# Patient Record
Sex: Female | Born: 1935 | Race: White | Hispanic: No | Marital: Married | Smoking: Never smoker
Health system: Southern US, Community
[De-identification: ages and names within clinical notes are randomized; demographics above are authoritative.]

## PROBLEM LIST (undated history)

## (undated) DIAGNOSIS — I509 Heart failure, unspecified: Secondary | ICD-10-CM

## (undated) DIAGNOSIS — J8410 Pulmonary fibrosis, unspecified: Secondary | ICD-10-CM

## (undated) DIAGNOSIS — I4891 Unspecified atrial fibrillation: Secondary | ICD-10-CM

## (undated) DIAGNOSIS — E785 Hyperlipidemia, unspecified: Secondary | ICD-10-CM

## (undated) DIAGNOSIS — J449 Chronic obstructive pulmonary disease, unspecified: Secondary | ICD-10-CM

## (undated) DIAGNOSIS — I10 Essential (primary) hypertension: Secondary | ICD-10-CM

## (undated) DIAGNOSIS — I255 Ischemic cardiomyopathy: Secondary | ICD-10-CM

## (undated) HISTORY — DX: Ischemic cardiomyopathy: I25.5

## (undated) HISTORY — DX: Hyperlipidemia, unspecified: E78.5

## (undated) HISTORY — DX: Heart failure, unspecified: I50.9

## (undated) HISTORY — DX: Essential (primary) hypertension: I10

## (undated) HISTORY — DX: Unspecified atrial fibrillation: I48.91

## (undated) HISTORY — DX: Pulmonary fibrosis, unspecified: J84.10

## (undated) HISTORY — DX: Chronic obstructive pulmonary disease, unspecified: J44.9

---

## 1993-08-06 HISTORY — PX: BACK SURGERY: SHX140

## 2007-02-04 ENCOUNTER — Ambulatory Visit: Payer: Self-pay | Admitting: Internal Medicine

## 2007-02-20 ENCOUNTER — Ambulatory Visit: Payer: Self-pay | Admitting: *Deleted

## 2007-02-25 ENCOUNTER — Telehealth: Payer: Self-pay | Admitting: Internal Medicine

## 2007-03-12 ENCOUNTER — Ambulatory Visit: Payer: Self-pay | Admitting: *Deleted

## 2007-03-12 DIAGNOSIS — F32A Depression, unspecified: Secondary | ICD-10-CM | POA: Insufficient documentation

## 2007-03-12 DIAGNOSIS — I10 Essential (primary) hypertension: Secondary | ICD-10-CM | POA: Insufficient documentation

## 2007-03-12 DIAGNOSIS — F329 Major depressive disorder, single episode, unspecified: Secondary | ICD-10-CM

## 2007-03-12 DIAGNOSIS — Z9189 Other specified personal risk factors, not elsewhere classified: Secondary | ICD-10-CM

## 2007-03-12 DIAGNOSIS — I5032 Chronic diastolic (congestive) heart failure: Secondary | ICD-10-CM | POA: Insufficient documentation

## 2007-03-12 DIAGNOSIS — M549 Dorsalgia, unspecified: Secondary | ICD-10-CM | POA: Insufficient documentation

## 2007-03-13 ENCOUNTER — Telehealth (INDEPENDENT_AMBULATORY_CARE_PROVIDER_SITE_OTHER): Payer: Self-pay | Admitting: *Deleted

## 2007-03-17 ENCOUNTER — Encounter (INDEPENDENT_AMBULATORY_CARE_PROVIDER_SITE_OTHER): Payer: Self-pay | Admitting: *Deleted

## 2007-03-31 ENCOUNTER — Encounter (INDEPENDENT_AMBULATORY_CARE_PROVIDER_SITE_OTHER): Payer: Self-pay | Admitting: *Deleted

## 2007-04-15 ENCOUNTER — Encounter (INDEPENDENT_AMBULATORY_CARE_PROVIDER_SITE_OTHER): Payer: Self-pay | Admitting: *Deleted

## 2007-04-17 ENCOUNTER — Encounter: Payer: Self-pay | Admitting: Internal Medicine

## 2008-08-06 HISTORY — PX: INGUINAL HERNIA REPAIR: SUR1180

## 2008-11-08 ENCOUNTER — Telehealth: Payer: Self-pay | Admitting: Internal Medicine

## 2009-11-22 ENCOUNTER — Telehealth: Payer: Self-pay | Admitting: Internal Medicine

## 2009-12-02 ENCOUNTER — Ambulatory Visit: Payer: Self-pay | Admitting: Family Medicine

## 2010-05-24 ENCOUNTER — Telehealth: Payer: Self-pay | Admitting: Internal Medicine

## 2010-09-06 NOTE — Progress Notes (Signed)
Summary: refill request for fibercon  Phone Note Refill Request Message from:  Fax from Pharmacy  Refills Requested: Medication #1:  FIBERCON 625 MG  TABS 2 tabs daily   Last Refilled: 08/29/2009 [facility]axed form from [facility] is on your desk.  Initial call taken by: Lowella Petties CMA,  November 22, 2009 5:06 PM  Follow-up for Phone Call        no longer under my care Follow-up by: Cindee Salt MD,  November 23, 2009 7:59 AM  Additional Follow-up for Phone Call Additional follow up Details #1::        Rx faxed back to pharmacy. Additional Follow-up by: Mervin Hack CMA Duncan Dull),  November 23, 2009 9:14 AM

## 2010-09-06 NOTE — Progress Notes (Signed)
Summary: NO LONGER UNDER DR.LETVAK'S CARE  Phone Note Refill Request Message from:  [facility] pharmacy on May 24, 2010 4:45 PM  Refills Requested: Medication #1:  FIBERCON 625 MG  TABS 2 tabs daily  Method Requested: Electronic Initial call taken by: Mervin Hack CMA Duncan Dull),  May 24, 2010 4:45 PM

## 2011-03-20 ENCOUNTER — Emergency Department: Payer: Self-pay | Admitting: Emergency Medicine

## 2011-03-20 ENCOUNTER — Encounter: Payer: Self-pay | Admitting: Internal Medicine

## 2011-04-07 ENCOUNTER — Encounter: Payer: Self-pay | Admitting: Internal Medicine

## 2011-05-07 ENCOUNTER — Encounter: Payer: Self-pay | Admitting: Internal Medicine

## 2011-08-21 ENCOUNTER — Encounter: Payer: Self-pay | Admitting: Internal Medicine

## 2011-08-21 ENCOUNTER — Ambulatory Visit (INDEPENDENT_AMBULATORY_CARE_PROVIDER_SITE_OTHER): Payer: Medicare Other | Admitting: Internal Medicine

## 2011-08-21 VITALS — BP 190/90 | HR 83 | Temp 97.5°F | Wt 103.0 lb

## 2011-08-21 DIAGNOSIS — I10 Essential (primary) hypertension: Secondary | ICD-10-CM | POA: Insufficient documentation

## 2011-08-21 DIAGNOSIS — J449 Chronic obstructive pulmonary disease, unspecified: Secondary | ICD-10-CM | POA: Insufficient documentation

## 2011-08-21 LAB — COMPREHENSIVE METABOLIC PANEL
AST: 25 U/L (ref 0–37)
BUN: 28 mg/dL — ABNORMAL HIGH (ref 6–23)
CO2: 28 mEq/L (ref 19–32)
Calcium: 9.8 mg/dL (ref 8.4–10.5)
Chloride: 102 mEq/L (ref 96–112)
Creatinine, Ser: 1 mg/dL (ref 0.4–1.2)
GFR: 55.37 mL/min — ABNORMAL LOW (ref >60.00)

## 2011-08-21 LAB — CBC WITH DIFFERENTIAL/PLATELET
Basophils Relative: 0.8 % (ref 0.0–3.0)
Eosinophils Absolute: 0.1 10*3/uL (ref 0.0–0.7)
Hemoglobin: 13.8 g/dL (ref 12.0–15.0)
Lymphocytes Relative: 25.5 % (ref 12.0–46.0)
MCHC: 33.9 g/dL (ref 30.0–36.0)
MCV: 87.9 fl (ref 78.0–100.0)
Neutro Abs: 4.3 10*3/uL (ref 1.4–7.7)
RBC: 4.63 Mil/uL (ref 3.87–5.11)

## 2011-08-21 NOTE — Progress Notes (Signed)
Subjective:    Patient ID: Wendy Sims, female    DOB: Feb 08, 1909, 78 y.o.   MRN: [id number]  HPI 78 year old female with history of hypertension, COPD presents to establish care. She reports that she is generally feeling well. She notes that over the last couple of months she had 2 episodes of COPD exacerbation which required antibiotics. She reports that her breathing has improved. She continues to have some hoarseness in the mornings and occasional cough productive of clear sputum. She denies any shortness of breath. She reports full compliance with her Pulmicort twice daily. She only rarely uses albuterol.  In regards to her hypertension, she reports that her blood pressure is lower generally at home. She notes that her blood pressures typically between 140 and 160/90-100. She is not interested in starting any new medications to help lower her blood pressure. She denies any headache, palpitations, chest pain, or other complaints. She reports full compliance with her carvedilol.  Outpatient Encounter Prescriptions as of 08/21/2011  Medication Sig Dispense Refill  . albuterol (PROVENTIL) (2.5 MG/3ML) 0.083% nebulizer solution Take 2.5 mg by nebulization every 6 (six) hours as needed.      . budesonide (PULMICORT) 0.5 MG/2ML nebulizer solution Take 0.5 mg by nebulization 2 (two) times daily.      . Calcium Polycarbophil (FIBER) 625 MG TABS Take by mouth daily.      . carvedilol (COREG) 6.25 MG tablet Take 6.25 mg by mouth 2 (two) times daily.      . vitamin B-12 (CYANOCOBALAMIN) 500 MCG tablet Take 500 mcg by mouth daily.      Marland Kitchen zolpidem (AMBIEN) 5 MG tablet Take 5 mg by mouth at bedtime as needed.        Review of Systems  Constitutional: Negative for fever, chills, appetite change, fatigue and unexpected weight change.  HENT: Negative for ear pain, congestion, sore throat, trouble swallowing, neck pain, voice change and sinus pressure.   Eyes: Negative for visual disturbance.    Respiratory: Negative for cough, shortness of breath, wheezing and stridor.   Cardiovascular: Negative for chest pain, palpitations and leg swelling.  Gastrointestinal: Negative for nausea, vomiting, abdominal pain, diarrhea, constipation, blood in stool, abdominal distention and anal bleeding.  Genitourinary: Negative for dysuria and flank pain.  Musculoskeletal: Negative for myalgias, arthralgias and gait problem.  Skin: Negative for color change and rash.  Neurological: Negative for dizziness and headaches.  Hematological: Negative for adenopathy. Does not bruise/bleed easily.  Psychiatric/Behavioral: Negative for suicidal ideas, sleep disturbance and dysphoric mood. The patient is not nervous/anxious.    BP 190/90  Pulse 83  Temp(Src) 97.5 F (36.4 C) (Oral)  Wt 103 lb (46.72 kg)  SpO2 98% Repeat BP 160/102    Objective:   Physical Exam  Constitutional: She is oriented to person, place, and time. She appears well-developed and well-nourished. No distress.  HENT:  Head: Normocephalic and atraumatic.  Right Ear: External ear normal.  Left Ear: External ear normal.  Nose: Nose normal.  Mouth/Throat: Oropharynx is clear and moist. No oropharyngeal exudate.  Eyes: Conjunctivae are normal. Pupils are equal, round, and reactive to light. Right eye exhibits no discharge. Left eye exhibits no discharge. No scleral icterus.  Neck: Normal range of motion. Neck supple. No tracheal deviation present. No thyromegaly present.  Cardiovascular: Normal rate, regular rhythm, normal heart sounds and intact distal pulses.  Exam reveals no gallop and no friction rub.   No murmur heard. Pulmonary/Chest: Effort normal and breath sounds normal. No respiratory distress.  She has no wheezes. She has no rales. She exhibits no tenderness.  Abdominal: Soft. Bowel sounds are normal. She exhibits no distension and no mass. There is no tenderness. There is no rebound and no guarding.  Musculoskeletal: Normal  range of motion. She exhibits no edema and no tenderness.  Lymphadenopathy:    She has no cervical adenopathy.  Neurological: She is alert and oriented to person, place, and time. No cranial nerve deficit. She exhibits normal muscle tone. Coordination normal.  Skin: Skin is warm and dry. No rash noted. She is not diaphoretic. No erythema. No pallor.  Psychiatric: She has a normal mood and affect. Her behavior is normal. Judgment and thought content normal.          Assessment & Plan:  1. Hypertension - BP elevated, however pt reports lower BP at home. She is not interested in starting any new medications. Will continue carvedilol and check renal function with labs today. Follow up 3 months.  2. COPD - reports recent exacerbation which has now resolved. Will continue Pulmicort and Albuterol prn. Follow up in 3 months.

## 2011-09-03 ENCOUNTER — Telehealth: Payer: Self-pay | Admitting: Internal Medicine

## 2011-09-03 MED ORDER — ZOLPIDEM TARTRATE 5 MG PO TABS: 5.0000 mg | ORAL_TABLET | Freq: Every evening | ORAL | Status: DC | PRN

## 2011-09-03 NOTE — Telephone Encounter (Signed)
Ambien called to [facility].

## 2011-09-03 NOTE — Telephone Encounter (Signed)
Fine to refill 1 month supply 

## 2011-09-03 NOTE — Telephone Encounter (Signed)
Patient is needing a refill on her Ambien 5 mg.

## 2011-09-03 NOTE — Telephone Encounter (Signed)
Patient is needing something less expensive than Pulmicort 0.5.

## 2011-09-03 NOTE — Telephone Encounter (Signed)
We can try to go thru [employer] to see if she would qualify. OK?

## 2011-09-03 NOTE — Telephone Encounter (Signed)
OKay

## 2011-09-04 ENCOUNTER — Ambulatory Visit: Payer: Self-pay | Admitting: Internal Medicine

## 2011-09-04 NOTE — Telephone Encounter (Signed)
I remember discussing w/dtr at [facility] that inhalers were very difficult for pt to use. I will start process for [employer] referral.

## 2011-09-04 NOTE — Telephone Encounter (Signed)
Pt was started on brovana a few years ago then at the last hospitalization was changed to pulmicort. Daughter wants to know if pt needs to use this med BID or can she stop med and use albuterol PRN? If pt needs to continue pulmicort, will proceed with [employer] forms.

## 2011-09-04 NOTE — Telephone Encounter (Signed)
She needs to continue Inhaled steroid.  Another option would be something like Advair if this is less expensive.

## 2011-09-05 NOTE — Telephone Encounter (Signed)
Left mess to call office back.   

## 2011-09-05 NOTE — Telephone Encounter (Signed)
[employer] referral faxed

## 2011-11-22 ENCOUNTER — Encounter: Payer: Self-pay | Admitting: Internal Medicine

## 2011-11-22 ENCOUNTER — Ambulatory Visit (INDEPENDENT_AMBULATORY_CARE_PROVIDER_SITE_OTHER): Payer: Medicare Other | Admitting: Internal Medicine

## 2011-11-22 VITALS — BP 122/84 | HR 96 | Temp 97.9°F | Wt 107.2 lb

## 2011-11-22 DIAGNOSIS — R5383 Other fatigue: Secondary | ICD-10-CM

## 2011-11-22 DIAGNOSIS — D510 Vitamin B12 deficiency anemia due to intrinsic factor deficiency: Secondary | ICD-10-CM

## 2011-11-22 DIAGNOSIS — K5900 Constipation, unspecified: Secondary | ICD-10-CM | POA: Insufficient documentation

## 2011-11-22 DIAGNOSIS — R109 Unspecified abdominal pain: Secondary | ICD-10-CM | POA: Insufficient documentation

## 2011-11-22 DIAGNOSIS — R5381 Other malaise: Secondary | ICD-10-CM

## 2011-11-22 DIAGNOSIS — I4891 Unspecified atrial fibrillation: Secondary | ICD-10-CM

## 2011-11-22 DIAGNOSIS — E039 Hypothyroidism, unspecified: Secondary | ICD-10-CM

## 2011-11-22 DIAGNOSIS — G4700 Insomnia, unspecified: Secondary | ICD-10-CM

## 2011-11-22 LAB — CBC WITH DIFFERENTIAL/PLATELET
Basophils Relative: 0.5 % (ref 0.0–3.0)
Eosinophils Absolute: 0.5 10*3/uL (ref 0.0–0.7)
Eosinophils Relative: 6 % — ABNORMAL HIGH (ref 0.0–5.0)
Hemoglobin: 12.1 g/dL (ref 12.0–15.0)
Lymphocytes Relative: 13.2 % (ref 12.0–46.0)
MCHC: 33.3 g/dL (ref 30.0–36.0)
Monocytes Relative: 8.8 % (ref 3.0–12.0)
Neutro Abs: 5.6 10*3/uL (ref 1.4–7.7)
Neutrophils Relative %: 71.5 % (ref 43.0–77.0)
RBC: 4.24 Mil/uL (ref 3.87–5.11)
WBC: 7.9 10*3/uL (ref 4.5–10.5)

## 2011-11-22 LAB — POCT URINALYSIS DIPSTICK
Glucose, UA: NEGATIVE
Leukocytes, UA: NEGATIVE
Nitrite, UA: NEGATIVE
Urobilinogen, UA: 0.2

## 2011-11-22 LAB — COMPREHENSIVE METABOLIC PANEL
BUN: 36 mg/dL — ABNORMAL HIGH (ref 6–23)
CO2: 22 mEq/L (ref 19–32)
Calcium: 9.5 mg/dL (ref 8.4–10.5)
Chloride: 97 mEq/L (ref 96–112)
Creatinine, Ser: 1.1 mg/dL (ref 0.4–1.2)
GFR: 51.11 mL/min — ABNORMAL LOW (ref >60.00)
Total Bilirubin: 0.2 mg/dL — ABNORMAL LOW (ref 0.3–1.2)

## 2011-11-22 LAB — TSH: TSH: 1.22 u[IU]/mL (ref 0.35–5.50)

## 2011-11-22 MED ORDER — ZOLPIDEM TARTRATE 5 MG PO TABS: 5.0000 mg | ORAL_TABLET | Freq: Every evening | ORAL | Status: DC | PRN

## 2011-11-22 MED ORDER — DOCUSATE SODIUM 100 MG PO CAPS: 100.0000 mg | ORAL_CAPSULE | Freq: Two times a day (BID) | ORAL | Status: AC

## 2011-11-22 NOTE — Assessment & Plan Note (Signed)
Likely secondary to constipation and possibly urinary tract infection. Will treat constipation with Colace. Will send urine for urinalysis and culture once patient is able to provide sample.

## 2011-11-22 NOTE — Assessment & Plan Note (Signed)
Will add Colace twice daily for the next few days. If no improvement in constipation patient will call the office.

## 2011-11-22 NOTE — Progress Notes (Signed)
Addended by: Jobie Quaker on: 11/22/2011 02:56 PM   Modules accepted: Orders

## 2011-11-22 NOTE — Assessment & Plan Note (Signed)
Likely multifactorial. Patient recently traveled and may have fatigue from this. She also has a history of atrial fibrillation and congestive heart failure, however does not have signs of decompensation or hypervolemia on exam today. Most suspicious of potential urinary tract infection given recent episode of constipation and some right lower quadrant pain. She was unable to give a urine sample today but will bring one by the clinic later today. Will check CBC, CMP, TSH, and B12 with labs today. Followup in one week or sooner if symptoms are worsening.

## 2011-11-22 NOTE — Assessment & Plan Note (Signed)
The patient is documented to have a history of atrial fibrillation and appears to be in atrial fibrillation today. It seems as if she has been rate controlled in the past, however previous records are not available for review. She is not currently anticoagulated. CHADS2 is 3, so relatively high risk of stroke.  Will discuss with family members to see if this has been addressed in the past.  Need to get records from cardiology to see last ECHO and if discussion of anticoagulation was initiated. Follow up 1 week.

## 2011-11-22 NOTE — Progress Notes (Signed)
Subjective:    Patient ID: Wendy Sims, female    DOB: 03-Dec-1908, 78 y.o.   MRN: [id number]  HPI 78 year old female presents for acute visit complaining of general fatigue. She reports that she first developed fatigue after traveling to visit relatives for her birthday. She denies any change in her chronic shortness of breath. She denies any chest pain. She does have some increased constipation which has not been responding to fiber supplements. She notes some mild right lower quadrant and pain. She denies dysuria, fever, chills. She reports a slight reduction in appetite over the last couple of weeks. She denies any nausea, vomiting. She denies any focal weakness.  Outpatient Encounter Prescriptions as of 11/22/2011  Medication Sig Dispense Refill  . albuterol (PROVENTIL) (2.5 MG/3ML) 0.083% nebulizer solution Take 2.5 mg by nebulization every 6 (six) hours as needed.      . budesonide (PULMICORT) 0.5 MG/2ML nebulizer solution Take 0.5 mg by nebulization 2 (two) times daily.      . Calcium Polycarbophil (FIBER) 625 MG TABS Take by mouth daily.      . carvedilol (COREG) 6.25 MG tablet Take 6.25 mg by mouth 2 (two) times daily.      . vitamin B-12 (CYANOCOBALAMIN) 500 MCG tablet Take 500 mcg by mouth daily.      Marland Kitchen zolpidem (AMBIEN) 5 MG tablet Take 1 tablet (5 mg total) by mouth at bedtime as needed.  30 tablet  5  . DISCONTD: zolpidem (AMBIEN) 5 MG tablet Take 1 tablet (5 mg total) by mouth at bedtime as needed.  30 tablet  0  . docusate sodium (COLACE) 100 MG capsule Take 1 capsule (100 mg total) by mouth 2 (two) times daily.  10 capsule  0    Review of Systems  Constitutional: Positive for fatigue. Negative for fever, chills, appetite change and unexpected weight change.  HENT: Negative for ear pain, congestion, sore throat, trouble swallowing, neck pain, voice change and sinus pressure.   Eyes: Negative for visual disturbance.  Respiratory: Positive for shortness of breath (chronic).  Negative for cough, wheezing and stridor.   Cardiovascular: Negative for chest pain, palpitations and leg swelling.  Gastrointestinal: Positive for constipation. Negative for nausea, vomiting, abdominal pain, diarrhea, blood in stool, abdominal distention and anal bleeding.  Genitourinary: Negative for dysuria and flank pain.  Musculoskeletal: Negative for myalgias, arthralgias and gait problem.  Skin: Negative for color change and rash.  Neurological: Negative for dizziness and headaches.  Hematological: Negative for adenopathy. Does not bruise/bleed easily.  Psychiatric/Behavioral: Negative for suicidal ideas, sleep disturbance and dysphoric mood. The patient is not nervous/anxious.    BP 122/84  Pulse 96  Temp(Src) 97.9 F (36.6 C) (Oral)  Wt 107 lb 4 oz (48.648 kg)  SpO2 97%     Objective:   Physical Exam  Constitutional: She is oriented to person, place, and time. She appears well-developed and well-nourished. No distress.  HENT:  Head: Normocephalic and atraumatic.  Right Ear: External ear normal.  Left Ear: External ear normal.  Nose: Nose normal.  Mouth/Throat: Oropharynx is clear and moist. No oropharyngeal exudate.  Eyes: Conjunctivae are normal. Pupils are equal, round, and reactive to light. Right eye exhibits no discharge. Left eye exhibits no discharge. No scleral icterus.  Neck: Normal range of motion. Neck supple. No tracheal deviation present. No thyromegaly present.  Cardiovascular: Normal rate, normal heart sounds and intact distal pulses.  An irregularly irregular rhythm present. Exam reveals no gallop and no friction rub.  No murmur heard. Pulmonary/Chest: Effort normal and breath sounds normal. No respiratory distress. She has no wheezes. She has no rales. She exhibits no tenderness.  Abdominal: Soft. Bowel sounds are normal. She exhibits distension. She exhibits no mass. There is no tenderness. There is no rebound and no guarding.  Musculoskeletal: Normal  range of motion. She exhibits no edema and no tenderness.  Lymphadenopathy:    She has no cervical adenopathy.  Neurological: She is alert and oriented to person, place, and time. No cranial nerve deficit. She exhibits normal muscle tone. Coordination normal.  Skin: Skin is warm and dry. No rash noted. She is not diaphoretic. No erythema. No pallor.  Psychiatric: She has a normal mood and affect. Her behavior is normal. Judgment and thought content normal.          Assessment & Plan:

## 2011-11-23 ENCOUNTER — Other Ambulatory Visit: Payer: Self-pay | Admitting: *Deleted

## 2011-11-23 MED ORDER — SULFAMETHOXAZOLE-TRIMETHOPRIM 400-80 MG PO TABS: 1.0000 | ORAL_TABLET | Freq: Two times a day (BID) | ORAL | Status: AC

## 2011-11-24 LAB — URINE CULTURE: Colony Count: 15000

## 2011-11-26 ENCOUNTER — Other Ambulatory Visit (INDEPENDENT_AMBULATORY_CARE_PROVIDER_SITE_OTHER): Payer: Medicare Other | Admitting: *Deleted

## 2011-11-26 DIAGNOSIS — N390 Urinary tract infection, site not specified: Secondary | ICD-10-CM

## 2011-11-28 ENCOUNTER — Ambulatory Visit: Payer: Medicare Other | Admitting: Internal Medicine

## 2011-11-28 LAB — URINE CULTURE: Colony Count: NO GROWTH

## 2011-11-29 ENCOUNTER — Ambulatory Visit (INDEPENDENT_AMBULATORY_CARE_PROVIDER_SITE_OTHER): Payer: Medicare Other | Admitting: Internal Medicine

## 2011-11-29 ENCOUNTER — Encounter: Payer: Self-pay | Admitting: Internal Medicine

## 2011-11-29 VITALS — BP 187/95 | HR 80 | Wt 107.0 lb

## 2011-11-29 DIAGNOSIS — I10 Essential (primary) hypertension: Secondary | ICD-10-CM

## 2011-11-29 DIAGNOSIS — K5900 Constipation, unspecified: Secondary | ICD-10-CM

## 2011-11-29 DIAGNOSIS — R5381 Other malaise: Secondary | ICD-10-CM

## 2011-11-29 DIAGNOSIS — R5383 Other fatigue: Secondary | ICD-10-CM

## 2011-11-29 NOTE — Assessment & Plan Note (Signed)
Symptoms have improved. Suspect this was related to urinary tract infection. Will continue to monitor.

## 2011-11-29 NOTE — Assessment & Plan Note (Signed)
Blood pressure is elevated today, however patient reports blood pressure has been well controlled at home. She is not interested in starting new medications. Will continue to monitor for now.

## 2011-11-29 NOTE — Assessment & Plan Note (Signed)
Symptoms have improved with resuming fiber regimen. We'll continue to monitor.

## 2011-11-29 NOTE — Progress Notes (Signed)
Subjective:    Patient ID: Wendy Sims, female    DOB: 01-27-1909, 78 y.o.   MRN: [id number]  HPI 78 year old female presents to followup after recent episode of generalized malaise, and constipation. She reports constipation has resolved by getting back on her fiber regimen. In regards to her general malaise, this has also improved. At her last visit, there is question of urinary tract infection. Initial urine culture showed multiple bacteria. She was treated with Bactrim. Repeat urine culture showed no growth. She denies any fever, chills, flank pain. She denies any new concerns today.  She has a history of hypertension, and reports that she checks her blood pressure at home and it is generally well controlled. She denies any chest pain, palpitations, shortness of breath. She reports full compliance with her medications.  Outpatient Encounter Prescriptions as of 11/29/2011  Medication Sig Dispense Refill  . albuterol (PROVENTIL) (2.5 MG/3ML) 0.083% nebulizer solution Take 2.5 mg by nebulization every 6 (six) hours as needed.      . budesonide (PULMICORT) 0.5 MG/2ML nebulizer solution Take 0.5 mg by nebulization 2 (two) times daily.      . Calcium Polycarbophil (FIBER) 625 MG TABS Take by mouth daily.      . carvedilol (COREG) 6.25 MG tablet Take 6.25 mg by mouth 2 (two) times daily.      Marland Kitchen sulfamethoxazole-trimethoprim (SEPTRA) 400-80 MG per tablet Take 1 tablet by mouth 2 (two) times daily.  14 tablet  0  . vitamin B-12 (CYANOCOBALAMIN) 500 MCG tablet Take 500 mcg by mouth daily.      Marland Kitchen zolpidem (AMBIEN) 5 MG tablet Take 1 tablet (5 mg total) by mouth at bedtime as needed.  30 tablet  5  . docusate sodium (COLACE) 100 MG capsule Take 1 capsule (100 mg total) by mouth 2 (two) times daily.  10 capsule  0   BP 187/95  Pulse 80  Wt 107 lb (48.535 kg)  SpO2 97%  Review of Systems  Constitutional: Negative for fever, chills, appetite change, fatigue and unexpected weight change.  HENT:  Negative for ear pain, congestion, sore throat, trouble swallowing, neck pain, voice change and sinus pressure.   Eyes: Negative for visual disturbance.  Respiratory: Negative for cough, shortness of breath, wheezing and stridor.   Cardiovascular: Negative for chest pain, palpitations and leg swelling.  Gastrointestinal: Negative for nausea, vomiting, abdominal pain, diarrhea, constipation, blood in stool, abdominal distention and anal bleeding.  Genitourinary: Negative for dysuria and flank pain.  Musculoskeletal: Negative for myalgias, arthralgias and gait problem.  Skin: Negative for color change and rash.  Neurological: Negative for dizziness and headaches.  Hematological: Negative for adenopathy. Does not bruise/bleed easily.  Psychiatric/Behavioral: Negative for suicidal ideas, sleep disturbance and dysphoric mood. The patient is not nervous/anxious.        Objective:   Physical Exam  Constitutional: She is oriented to person, place, and time. She appears well-developed and well-nourished. No distress.  HENT:  Head: Normocephalic and atraumatic.  Right Ear: External ear normal.  Left Ear: External ear normal.  Nose: Nose normal.  Mouth/Throat: Oropharynx is clear and moist. No oropharyngeal exudate.  Eyes: Conjunctivae are normal. Pupils are equal, round, and reactive to light. Right eye exhibits no discharge. Left eye exhibits no discharge. No scleral icterus.  Neck: Normal range of motion. Neck supple. No tracheal deviation present. No thyromegaly present.  Cardiovascular: Normal rate, regular rhythm and intact distal pulses.  Exam reveals no gallop and no friction rub.   Murmur  heard. Pulmonary/Chest: Effort normal and breath sounds normal. No respiratory distress. She has no wheezes. She has no rales. She exhibits no tenderness.  Musculoskeletal: Normal range of motion. She exhibits no edema and no tenderness.  Lymphadenopathy:    She has no cervical adenopathy.    Neurological: She is alert and oriented to person, place, and time. No cranial nerve deficit. She exhibits normal muscle tone. Coordination normal.  Skin: Skin is warm and dry. No rash noted. She is not diaphoretic. No erythema. No pallor.  Psychiatric: She has a normal mood and affect. Her behavior is normal. Judgment and thought content normal.          Assessment & Plan:

## 2012-01-15 ENCOUNTER — Other Ambulatory Visit: Payer: Self-pay | Admitting: *Deleted

## 2012-01-15 ENCOUNTER — Telehealth: Payer: Self-pay | Admitting: Internal Medicine

## 2012-01-15 MED ORDER — CARVEDILOL 6.25 MG PO TABS: 6.2500 mg | ORAL_TABLET | Freq: Two times a day (BID) | ORAL | Status: DC

## 2012-01-15 NOTE — Telephone Encounter (Signed)
Rx sent in today. 

## 2012-01-15 NOTE — Telephone Encounter (Signed)
Refill on  Coreg 6.25 mg

## 2012-03-04 ENCOUNTER — Telehealth: Payer: Self-pay | Admitting: Internal Medicine

## 2012-03-04 NOTE — Telephone Encounter (Signed)
Should be seen for unilateral leg swelling.

## 2012-03-04 NOTE — Telephone Encounter (Signed)
Left message on cell phone voicemail for Steward Drone to return call.

## 2012-03-04 NOTE — Telephone Encounter (Signed)
Patient coming to see Dr. Dan Humphreys tomorrow at 10:00.

## 2012-03-04 NOTE — Telephone Encounter (Signed)
Pt daughter brenda stop by and stated that ms Sirmons left foot is swelling and wanted to know if you would give her a rx for fluid pill.  Steward Drone stated she gave ms Schomer her lasik  That brenda had for 5 days and this seemed to help medicap  Please advise Pt called yesterday 7/30 and was sent to triage pt refused to leave message

## 2012-03-05 ENCOUNTER — Encounter: Payer: Self-pay | Admitting: Internal Medicine

## 2012-03-05 ENCOUNTER — Ambulatory Visit (INDEPENDENT_AMBULATORY_CARE_PROVIDER_SITE_OTHER): Payer: Medicare Other | Admitting: Internal Medicine

## 2012-03-05 VITALS — BP 118/80 | HR 96 | Temp 97.9°F | Wt 111.5 lb

## 2012-03-05 DIAGNOSIS — I10 Essential (primary) hypertension: Secondary | ICD-10-CM

## 2012-03-05 DIAGNOSIS — R609 Edema, unspecified: Secondary | ICD-10-CM | POA: Insufficient documentation

## 2012-03-05 MED ORDER — FUROSEMIDE 20 MG PO TABS: 20.0000 mg | ORAL_TABLET | Freq: Every day | ORAL | Status: DC

## 2012-03-05 MED ORDER — CARVEDILOL 6.25 MG PO TABS: 6.2500 mg | ORAL_TABLET | Freq: Two times a day (BID) | ORAL | Status: DC

## 2012-03-05 NOTE — Assessment & Plan Note (Signed)
Blood pressure well-controlled on current medications. Will continue. Will repeat renal function in 3 months.

## 2012-03-05 NOTE — Progress Notes (Signed)
Subjective:    Patient ID: Wendy Sims, female    DOB: [birth year], 78 y.o.   MRN: [id number]  HPI 78 year old female presents for acute visit complaining of left greater than right lower extremity edema after taking car trip. Her daughter-in-law noticed the symptoms after her car trip is complete. She gave her some of her own Lasix 20 mg tablets, daily x4 days. She reports that the edema subsequently resolved. She denies any shortness of breath, chest pain, palpitations. She denies any pain in her calves.  Outpatient Encounter Prescriptions as of 03/05/2012  Medication Sig Dispense Refill  . albuterol (PROVENTIL) (2.5 MG/3ML) 0.083% nebulizer solution Take 2.5 mg by nebulization every 6 (six) hours as needed.      . budesonide (PULMICORT) 0.5 MG/2ML nebulizer solution Take 0.5 mg by nebulization 2 (two) times daily.      . Calcium Polycarbophil (FIBER) 625 MG TABS Take by mouth daily.      . carvedilol (COREG) 6.25 MG tablet Take 1 tablet (6.25 mg total) by mouth 2 (two) times daily.  180 tablet  3  . vitamin B-12 (CYANOCOBALAMIN) 500 MCG tablet Take 500 mcg by mouth daily.      Marland Kitchen zolpidem (AMBIEN) 5 MG tablet Take 1 tablet (5 mg total) by mouth at bedtime as needed.  30 tablet  5  . DISCONTD: carvedilol (COREG) 6.25 MG tablet Take 1 tablet (6.25 mg total) by mouth 2 (two) times daily.  60 tablet  3  . furosemide (LASIX) 20 MG tablet Take 1 tablet (20 mg total) by mouth daily.  30 tablet  3   Patient Active Problem List  Diagnosis  . DISORDER, DEPRESSIVE NEC  . HYPERTENSION  . C H F  . BACK PAIN  . INSOMNIA, HX OF  . Hypertension  . COPD (chronic obstructive pulmonary disease)  . Fatigue  . Constipation  . Campath-induced atrial fibrillation     Review of Systems  Constitutional: Negative for fever, chills, appetite change, fatigue and unexpected weight change.  HENT: Negative for ear pain, congestion, sore throat, trouble swallowing, neck pain, voice change and sinus  pressure.   Eyes: Negative for visual disturbance.  Respiratory: Negative for cough, shortness of breath, wheezing and stridor.   Cardiovascular: Positive for leg swelling. Negative for chest pain and palpitations.  Gastrointestinal: Negative for nausea, vomiting, abdominal pain, diarrhea, constipation, blood in stool, abdominal distention and anal bleeding.  Genitourinary: Negative for dysuria and flank pain.  Musculoskeletal: Negative for myalgias, arthralgias and gait problem.  Skin: Negative for color change and rash.  Neurological: Negative for dizziness and headaches.  Hematological: Negative for adenopathy. Does not bruise/bleed easily.  Psychiatric/Behavioral: Negative for suicidal ideas, disturbed wake/sleep cycle and dysphoric mood. The patient is not nervous/anxious.    BP 118/80  Pulse 96  Temp 97.9 F (36.6 C) (Oral)  Wt 111 lb 8 oz (50.576 kg)  SpO2 97%     Objective:   Physical Exam  Constitutional: She is oriented to person, place, and time. She appears well-developed and well-nourished. No distress.  HENT:  Head: Normocephalic and atraumatic.  Right Ear: External ear normal.  Left Ear: External ear normal.  Nose: Nose normal.  Mouth/Throat: Oropharynx is clear and moist. No oropharyngeal exudate.  Eyes: Conjunctivae are normal. Pupils are equal, round, and reactive to light. Right eye exhibits no discharge. Left eye exhibits no discharge. No scleral icterus.  Neck: Normal range of motion. Neck supple. No tracheal deviation present. No thyromegaly present.  Cardiovascular: Normal  rate, normal heart sounds and intact distal pulses.  An irregularly irregular rhythm present. Exam reveals no gallop and no friction rub.   No murmur heard. Pulmonary/Chest: Effort normal and breath sounds normal. No respiratory distress. She has no wheezes. She has no rales. She exhibits no tenderness.  Musculoskeletal: Normal range of motion. She exhibits edema (trace L>R LE). She exhibits  no tenderness.  Lymphadenopathy:    She has no cervical adenopathy.  Neurological: She is alert and oriented to person, place, and time. No cranial nerve deficit. She exhibits normal muscle tone. Coordination normal.  Skin: Skin is warm and dry. No rash noted. She is not diaphoretic. No erythema. No pallor.  Psychiatric: She has a normal mood and affect. Her behavior is normal. Judgment and thought content normal.          Assessment & Plan:

## 2012-03-05 NOTE — Assessment & Plan Note (Signed)
Edema likely secondary to chronic venous insufficiency. Symptoms have resolved after use of Lasix x4 days.  We discussed the potential risk of intravascular volume depletion with overuse of Lasix. She will use conservative measures first including keeping legs elevated and avoiding excessive intake of salt. Will plan to continue to use Lasix as needed for lower extremity edema. Her daughter will monitor her closely.We'll plan to repeat renal function at her next visit in 3 months.

## 2012-03-26 ENCOUNTER — Telehealth: Payer: Self-pay | Admitting: Internal Medicine

## 2012-03-26 NOTE — Telephone Encounter (Signed)
Brenda/Daughter called to report wound left outer leg mom had injured approx 2 weeks ago, she has "picked at it" and now the surface has a dried blood appearance to it, area approx 1" x1/2"  with dry surface, afebrile, denies emergent s/s per Laceration Guideline,See in 24 hr disposition obtained due to sign of infection, she has made an appt for 03/27/12 at 10:15, callback perimeters gone over

## 2012-03-27 ENCOUNTER — Encounter: Payer: Self-pay | Admitting: Nurse Practitioner

## 2012-03-27 ENCOUNTER — Encounter: Payer: Self-pay | Admitting: Cardiothoracic Surgery

## 2012-03-27 ENCOUNTER — Ambulatory Visit (INDEPENDENT_AMBULATORY_CARE_PROVIDER_SITE_OTHER): Payer: Medicare Other | Admitting: Internal Medicine

## 2012-03-27 ENCOUNTER — Encounter: Payer: Self-pay | Admitting: Internal Medicine

## 2012-03-27 VITALS — BP 124/82 | HR 81 | Temp 97.9°F | Wt 110.5 lb

## 2012-03-27 DIAGNOSIS — S81802A Unspecified open wound, left lower leg, initial encounter: Secondary | ICD-10-CM | POA: Insufficient documentation

## 2012-03-27 DIAGNOSIS — S91009A Unspecified open wound, unspecified ankle, initial encounter: Secondary | ICD-10-CM

## 2012-03-27 DIAGNOSIS — R609 Edema, unspecified: Secondary | ICD-10-CM

## 2012-03-27 MED ORDER — SULFAMETHOXAZOLE-TRIMETHOPRIM 400-80 MG PO TABS: 1.0000 | ORAL_TABLET | Freq: Two times a day (BID) | ORAL | Status: AC

## 2012-03-27 NOTE — Progress Notes (Signed)
  Subjective:    Patient ID: Aslin Farinas, female    DOB: 06-28-1909, 78 y.o.   MRN: [id number]  HPI  78 year old female with history of atrial fibrillation and COPD presents for acute visit complaining of a wound on her left lower leg. She is unsure when this developed. He has been present for at least several days. Her daughter-in-law noticed that the wound was becoming darker with more surrounding redness. Patient reports the wound is painful but denies any fever, chills. She has also noted increased swelling in her left lower leg.  Outpatient Encounter Prescriptions as of 03/27/2012  Medication Sig Dispense Refill  . albuterol (PROVENTIL) (2.5 MG/3ML) 0.083% nebulizer solution Take 2.5 mg by nebulization every 6 (six) hours as needed.      . budesonide (PULMICORT) 0.5 MG/2ML nebulizer solution Take 0.5 mg by nebulization 2 (two) times daily.      . Calcium Polycarbophil (FIBER) 625 MG TABS Take by mouth daily.      . carvedilol (COREG) 6.25 MG tablet Take 1 tablet (6.25 mg total) by mouth 2 (two) times daily.  180 tablet  3  . furosemide (LASIX) 20 MG tablet Take 1 tablet (20 mg total) by mouth daily.  30 tablet  3  . vitamin B-12 (CYANOCOBALAMIN) 500 MCG tablet Take 500 mcg by mouth daily.      Marland Kitchen zolpidem (AMBIEN) 5 MG tablet Take 1 tablet (5 mg total) by mouth at bedtime as needed.  30 tablet  5  . sulfamethoxazole-trimethoprim (SEPTRA) 400-80 MG per tablet Take 1 tablet by mouth 2 (two) times daily.  14 tablet  0    BP 124/82  Pulse 81  Temp 97.9 F (36.6 C) (Oral)  Wt 110 lb 8 oz (50.122 kg)  SpO2 94%  Review of Systems  Constitutional: Negative for fever, chills and fatigue.  Respiratory: Negative for shortness of breath.   Cardiovascular: Positive for leg swelling. Negative for chest pain.  Skin: Positive for color change and wound.       Objective:   Physical Exam  Constitutional: She is oriented to person, place, and time. She appears well-developed and  well-nourished. No distress.  HENT:  Head: Normocephalic and atraumatic.  Eyes: Pupils are equal, round, and reactive to light.  Neck: Normal range of motion.  Pulmonary/Chest: Effort normal.  Musculoskeletal: She exhibits edema and tenderness.  Neurological: She is alert and oriented to person, place, and time.  Skin: Skin is warm. She is not diaphoretic. There is erythema.     Psychiatric: She has a normal mood and affect. Her behavior is normal. Judgment and thought content normal.          Assessment & Plan:

## 2012-03-27 NOTE — Assessment & Plan Note (Signed)
Left lower extremity wounds likely secondary to skin tear which is now also rated. We discussed the risk of poor healing given ongoing issues with edema and location of wound. I think she would benefit from evaluation at the wound healing Center with possible debridement and application of dressing such as a honey-based dressing. Appointment scheduled for today.

## 2012-03-27 NOTE — Assessment & Plan Note (Signed)
Patient with chronic lower extremity edema likely secondary to chronic venous insufficiency. Uses Lasix intermittently. Encouraged her to keep legs elevated as much as possible and limit sodium intake. Followup 2 weeks.

## 2012-03-31 ENCOUNTER — Ambulatory Visit: Payer: Medicare Other | Admitting: Internal Medicine

## 2012-05-19 ENCOUNTER — Other Ambulatory Visit: Payer: Self-pay | Admitting: Internal Medicine

## 2012-05-19 DIAGNOSIS — I10 Essential (primary) hypertension: Secondary | ICD-10-CM

## 2012-05-19 DIAGNOSIS — R609 Edema, unspecified: Secondary | ICD-10-CM

## 2012-05-19 MED ORDER — CARVEDILOL 6.25 MG PO TABS: 6.2500 mg | ORAL_TABLET | Freq: Two times a day (BID) | ORAL | Status: DC

## 2012-05-19 MED ORDER — FUROSEMIDE 20 MG PO TABS: 20.0000 mg | ORAL_TABLET | Freq: Every day | ORAL | Status: DC

## 2012-05-19 NOTE — Telephone Encounter (Signed)
Spoke with patient's daughter and patient needs Coreg Rx refilled, Rx for Lasix was refilled by I called the pharmacy to cancel the Lasix Rx.  Rx for Coreg sent electronically.

## 2012-05-19 NOTE — Telephone Encounter (Signed)
Pt's daughter is calling concerning pt's meds. She is completely out of her b/p meds and didn't have one to take today. She uses [facility] and is needing a refill as soon as possible.

## 2012-05-30 ENCOUNTER — Telehealth: Payer: Self-pay | Admitting: Internal Medicine

## 2012-05-30 DIAGNOSIS — G4700 Insomnia, unspecified: Secondary | ICD-10-CM

## 2012-05-30 NOTE — Telephone Encounter (Signed)
Refill request for controlled substance  zolpidem tartrate 5 mg tab Sig: take one tablet at bedtime as needed.

## 2012-05-30 NOTE — Telephone Encounter (Signed)
Pt is needing refill on sleeping meds and would like 90 day supply. She uses [facility].

## 2012-05-31 NOTE — Telephone Encounter (Signed)
Fine to fill. #90 refill 1

## 2012-06-02 MED ORDER — ZOLPIDEM TARTRATE 5 MG PO TABS: 5.0000 mg | ORAL_TABLET | Freq: Every evening | ORAL | Status: DC | PRN

## 2012-06-02 NOTE — Telephone Encounter (Signed)
Rx called to [facility] pharmacy. 

## 2012-06-11 ENCOUNTER — Telehealth: Payer: Self-pay | Admitting: Internal Medicine

## 2012-06-11 ENCOUNTER — Ambulatory Visit (INDEPENDENT_AMBULATORY_CARE_PROVIDER_SITE_OTHER): Payer: Medicare Other | Admitting: Internal Medicine

## 2012-06-11 ENCOUNTER — Encounter: Payer: Self-pay | Admitting: Internal Medicine

## 2012-06-11 VITALS — BP 122/80 | HR 89 | Temp 98.7°F | Wt 114.5 lb

## 2012-06-11 DIAGNOSIS — F3289 Other specified depressive episodes: Secondary | ICD-10-CM

## 2012-06-11 DIAGNOSIS — I10 Essential (primary) hypertension: Secondary | ICD-10-CM

## 2012-06-11 DIAGNOSIS — Z23 Encounter for immunization: Secondary | ICD-10-CM

## 2012-06-11 DIAGNOSIS — R0609 Other forms of dyspnea: Secondary | ICD-10-CM

## 2012-06-11 DIAGNOSIS — R609 Edema, unspecified: Secondary | ICD-10-CM

## 2012-06-11 DIAGNOSIS — F329 Major depressive disorder, single episode, unspecified: Secondary | ICD-10-CM

## 2012-06-11 DIAGNOSIS — R0600 Dyspnea, unspecified: Secondary | ICD-10-CM

## 2012-06-11 LAB — COMPREHENSIVE METABOLIC PANEL
ALT: 10 U/L (ref 0–35)
Albumin: 3.5 g/dL (ref 3.5–5.2)
CO2: 28 mEq/L (ref 19–32)
Calcium: 9.3 mg/dL (ref 8.4–10.5)
Chloride: 100 mEq/L (ref 96–112)
GFR: 54.64 mL/min — ABNORMAL LOW (ref >60.00)
Potassium: 4.7 mEq/L (ref 3.5–5.1)
Sodium: 134 mEq/L — ABNORMAL LOW (ref 135–145)
Total Bilirubin: 0.5 mg/dL (ref 0.3–1.2)
Total Protein: 7.1 g/dL (ref 6.0–8.3)

## 2012-06-11 NOTE — Progress Notes (Signed)
Subjective:    Patient ID: Wendy Sims, female    DOB: 1909-02-03, 78 y.o.   MRN: [id number]  HPI 78 year old female with history of atrial fibrillation, congestive heart failure, depression, insomnia presents for followup. She presents with her daughter who reports that patient has been more anxious and tearful recently. They're currently going through issue in her family with illness of one of the grandchildren. Patient reports that she has difficulty dealing with this situation and finds herself frequently worrying. She denies difficulty sleeping and has been using her Ambien to help with sleep. She does not want to start any medication to help with depression or anxiety.  Her daughter has also noted that she appears to be more short of breath recently. Patient notes that she has not been taking her Lasix on a regular basis. She has noted increased shortness of breath compared to baseline and increased swelling in her lower extremities. She has been trying to limit sodium in her diet and stay physically active. She denies chest pain, fever, chills, palpitations.  Outpatient Encounter Prescriptions as of 06/11/2012  Medication Sig Dispense Refill  . albuterol (PROVENTIL) (2.5 MG/3ML) 0.083% nebulizer solution Take 2.5 mg by nebulization every 6 (six) hours as needed.      . budesonide (PULMICORT) 0.5 MG/2ML nebulizer solution Take 0.5 mg by nebulization 2 (two) times daily.      . Calcium Polycarbophil (FIBER) 625 MG TABS Take by mouth daily.      . carvedilol (COREG) 6.25 MG tablet Take 1 tablet (6.25 mg total) by mouth 2 (two) times daily.  180 tablet  3  . furosemide (LASIX) 20 MG tablet Take 1 tablet (20 mg total) by mouth daily.  30 tablet  6  . vitamin B-12 (CYANOCOBALAMIN) 500 MCG tablet Take 500 mcg by mouth daily.      Marland Kitchen zolpidem (AMBIEN) 5 MG tablet Take 1 tablet (5 mg total) by mouth at bedtime as needed.  90 tablet  1   BP 122/80  Pulse 89  Temp 98.7 F (37.1 C) (Oral)  Wt  114 lb 8 oz (51.937 kg)  SpO2 99%   Review of Systems  Constitutional: Negative for fever, chills, appetite change, fatigue and unexpected weight change.  HENT: Negative for ear pain, congestion, sore throat, trouble swallowing, neck pain, voice change and sinus pressure.   Eyes: Negative for visual disturbance.  Respiratory: Positive for shortness of breath. Negative for cough, wheezing and stridor.   Cardiovascular: Positive for leg swelling. Negative for chest pain and palpitations.  Gastrointestinal: Negative for nausea, vomiting, abdominal pain, diarrhea, constipation, blood in stool, abdominal distention and anal bleeding.  Genitourinary: Negative for dysuria and flank pain.  Musculoskeletal: Negative for myalgias, arthralgias and gait problem.  Skin: Negative for color change and rash.  Neurological: Negative for dizziness and headaches.  Hematological: Negative for adenopathy. Does not bruise/bleed easily.  Psychiatric/Behavioral: Positive for sleep disturbance and dysphoric mood. Negative for suicidal ideas. The patient is nervous/anxious.        Objective:   Physical Exam  Constitutional: She is oriented to person, place, and time. She appears well-developed and well-nourished. No distress.  HENT:  Head: Normocephalic and atraumatic.  Right Ear: External ear normal.  Left Ear: External ear normal.  Nose: Nose normal.  Mouth/Throat: Oropharynx is clear and moist. No oropharyngeal exudate.  Eyes: Conjunctivae normal are normal. Pupils are equal, round, and reactive to light. Right eye exhibits no discharge. Left eye exhibits no discharge. No scleral icterus.  Neck: Normal range of motion. Neck supple. No tracheal deviation present. No thyromegaly present.  Cardiovascular: Normal rate, regular rhythm, normal heart sounds and intact distal pulses.  Exam reveals no gallop and no friction rub.   No murmur heard. Pulmonary/Chest: Effort normal. No accessory muscle usage. Not  tachypneic. No respiratory distress. She has no decreased breath sounds. She has no wheezes. She has no rhonchi. She has rales in the right lower field and the left lower field. She exhibits no tenderness.  Musculoskeletal: Normal range of motion. She exhibits no edema and no tenderness.  Lymphadenopathy:    She has no cervical adenopathy.  Neurological: She is alert and oriented to person, place, and time. No cranial nerve deficit. She exhibits normal muscle tone. Coordination normal.  Skin: Skin is warm and dry. No rash noted. She is not diaphoretic. No erythema. No pallor.  Psychiatric: Her behavior is normal. Judgment and thought content normal. Her mood appears anxious. She exhibits a depressed mood.          Assessment & Plan:

## 2012-06-11 NOTE — Telephone Encounter (Signed)
Labs today were normal.

## 2012-06-11 NOTE — Assessment & Plan Note (Signed)
Blood pressure well-controlled on current medications. Will continue. Will check renal function with labs today. Followup in 6 months or sooner as needed.

## 2012-06-11 NOTE — Assessment & Plan Note (Signed)
Edema slightly increased today in lower extremities and crackles noted on pulmonary exam consistent with hypervolemia. Patient has forgotten to take her furosemide for the last several days. Encouraged her to take this medication daily. Will continue to monitor.

## 2012-06-11 NOTE — Assessment & Plan Note (Signed)
Patient has some chronic shortness of breath. Appears to be hypervolemic on exam today, likely leading to worsening dyspnea. Encouraged her to take furosemide daily. She will continue to monitor symptoms if symptoms of dyspnea and lower extremity edema are not improving with use of furosemide she will call or return to clinic.

## 2012-06-11 NOTE — Assessment & Plan Note (Signed)
Patient tearful today describing ongoing medical issues in her family. Offered support today. Patient does not wish to start any additional medications to help with symptoms of depression at this time. We'll continue to monitor.

## 2012-06-13 NOTE — Telephone Encounter (Signed)
Message left on machine at home advising patient as instructed. 

## 2012-06-28 ENCOUNTER — Emergency Department: Payer: Self-pay | Admitting: Emergency Medicine

## 2012-06-28 LAB — CBC
HCT: 34.4 % — ABNORMAL LOW (ref 40.0–52.0)
HGB: 11.4 g/dL — ABNORMAL LOW (ref 12.0–16.0)
MCH: 28.2 pg (ref 26.0–34.0)
MCV: 85 fL (ref 80–100)
Platelet: 214 10*3/uL (ref 150–440)
WBC: 8.6 10*3/uL (ref 3.8–10.6)

## 2012-06-28 LAB — BASIC METABOLIC PANEL
Anion Gap: 6 — ABNORMAL LOW (ref 7–16)
BUN: 25 mg/dL — ABNORMAL HIGH (ref 7–18)
Calcium, Total: 9.4 mg/dL (ref 8.5–10.1)
Chloride: 101 mmol/L (ref 98–107)
Co2: 25 mmol/L (ref 21–32)
Creatinine: 1 mg/dL (ref 0.60–1.30)
EGFR (African American): 53 — ABNORMAL LOW
EGFR (Non-African Amer.): 46 — ABNORMAL LOW
Glucose: 96 mg/dL (ref 65–99)
Osmolality: 269 (ref 275–301)
Potassium: 4.5 mmol/L (ref 3.5–5.1)
Sodium: 132 mmol/L — ABNORMAL LOW (ref 136–145)

## 2012-06-28 LAB — PRO B NATRIURETIC PEPTIDE: B-Type Natriuretic Peptide: 3684 pg/mL — ABNORMAL HIGH (ref 0–125)

## 2012-06-28 LAB — TROPONIN I: Troponin-I: <0.02 ng/mL

## 2012-07-09 ENCOUNTER — Ambulatory Visit (INDEPENDENT_AMBULATORY_CARE_PROVIDER_SITE_OTHER)
Admission: RE | Admit: 2012-07-09 | Discharge: 2012-07-09 | Disposition: A | Discharge status: Home / Self Care | Payer: Medicare Other | Source: Ambulatory Visit | Attending: Internal Medicine | Admitting: Internal Medicine

## 2012-07-09 ENCOUNTER — Ambulatory Visit (INDEPENDENT_AMBULATORY_CARE_PROVIDER_SITE_OTHER): Payer: Medicare Other | Admitting: Internal Medicine

## 2012-07-09 ENCOUNTER — Encounter: Payer: Self-pay | Admitting: Internal Medicine

## 2012-07-09 VITALS — BP 128/80 | HR 77 | Temp 98.4°F | Resp 16 | Wt 107.2 lb

## 2012-07-09 DIAGNOSIS — R0600 Dyspnea, unspecified: Secondary | ICD-10-CM

## 2012-07-09 DIAGNOSIS — I509 Heart failure, unspecified: Secondary | ICD-10-CM

## 2012-07-09 DIAGNOSIS — I4891 Unspecified atrial fibrillation: Secondary | ICD-10-CM

## 2012-07-09 DIAGNOSIS — R0609 Other forms of dyspnea: Secondary | ICD-10-CM

## 2012-07-09 LAB — COMPREHENSIVE METABOLIC PANEL
ALT: 9 U/L (ref 0–35)
Alkaline Phosphatase: 70 U/L (ref 39–117)
CO2: 27 mEq/L (ref 19–32)
Creatinine, Ser: 1 mg/dL (ref 0.4–1.2)
GFR: 53.39 mL/min — ABNORMAL LOW (ref >60.00)
Total Bilirubin: 0.4 mg/dL (ref 0.3–1.2)

## 2012-07-09 NOTE — Progress Notes (Signed)
Subjective:    Patient ID: Wendy Sims, female    DOB: [birth year], 78 y.o.   MRN: [id number]  HPI 78 year old female with history of atrial fibrillation and congestive heart failure presents for followup after recent ER evaluation for dyspnea. Her daughter reports that she was taken to the hospital complaining of shortness of breath. She was given injection of Lasix with improvement in her symptoms. Chest x-ray performed during that evaluation showed edema. She was told to take her Lasix on a daily basis and her dose of carvedilol was increased. During the ER evaluation she was noted to have elevated blood pressure, 200/140. Her daughter reports that this has resolved at home. The patient has resumed her previous dose of carvedilol 6.25 mg twice daily. She has been taking Lasix approximately twice per week. She reports some persistent shortness of breath but denies chest pain, fever, cough, chest pain.  Outpatient Encounter Prescriptions as of 07/09/2012  Medication Sig Dispense Refill  . albuterol (PROVENTIL) (2.5 MG/3ML) 0.083% nebulizer solution Take 2.5 mg by nebulization every 6 (six) hours as needed.      . Calcium Polycarbophil (FIBER) 625 MG TABS Take by mouth daily.      . carvedilol (COREG) 6.25 MG tablet Take 1 tablet (6.25 mg total) by mouth 2 (two) times daily.  180 tablet  3  . furosemide (LASIX) 20 MG tablet Take 1 tablet (20 mg total) by mouth daily.  30 tablet  6  . vitamin B-12 (CYANOCOBALAMIN) 500 MCG tablet Take 500 mcg by mouth daily.      Marland Kitchen zolpidem (AMBIEN) 5 MG tablet Take 1 tablet (5 mg total) by mouth at bedtime as needed.  90 tablet  1  . budesonide (PULMICORT) 0.5 MG/2ML nebulizer solution Take 0.5 mg by nebulization 2 (two) times daily.        Review of Systems  Constitutional: Negative for fever, chills, appetite change, fatigue and unexpected weight change.  HENT: Negative for ear pain, congestion, sore throat, trouble swallowing, neck pain, voice change and  sinus pressure.   Eyes: Negative for visual disturbance.  Respiratory: Positive for shortness of breath. Negative for cough, wheezing and stridor.   Cardiovascular: Negative for chest pain, palpitations and leg swelling.  Gastrointestinal: Negative for nausea, vomiting, abdominal pain, diarrhea, constipation, blood in stool, abdominal distention and anal bleeding.  Genitourinary: Negative for dysuria and flank pain.  Musculoskeletal: Negative for myalgias, arthralgias and gait problem.  Skin: Negative for color change and rash.  Neurological: Negative for dizziness and headaches.  Hematological: Negative for adenopathy. Does not bruise/bleed easily.  Psychiatric/Behavioral: Negative for suicidal ideas, sleep disturbance and dysphoric mood. The patient is not nervous/anxious.        Objective:   Physical Exam  Constitutional: She is oriented to person, place, and time. She appears well-developed and well-nourished. No distress.  HENT:  Head: Normocephalic and atraumatic.  Right Ear: External ear normal.  Left Ear: External ear normal.  Nose: Nose normal.  Mouth/Throat: Oropharynx is clear and moist. No oropharyngeal exudate.  Eyes: Conjunctivae normal are normal. Pupils are equal, round, and reactive to light. Right eye exhibits no discharge. Left eye exhibits no discharge. No scleral icterus.  Neck: Normal range of motion. Neck supple. No tracheal deviation present. No thyromegaly present.  Cardiovascular: Normal rate, normal heart sounds and intact distal pulses.  An irregularly irregular rhythm present. Exam reveals no gallop and no friction rub.   No murmur heard. Pulmonary/Chest: Effort normal. No accessory muscle usage. Not tachypneic.  No respiratory distress. She has no decreased breath sounds. She has no wheezes. She has no rhonchi. She has rales in the left lower field. She exhibits no tenderness.  Musculoskeletal: Normal range of motion. She exhibits no edema and no tenderness.   Lymphadenopathy:    She has no cervical adenopathy.  Neurological: She is alert and oriented to person, place, and time. No cranial nerve deficit. She exhibits normal muscle tone. Coordination normal.  Skin: Skin is warm and dry. No rash noted. She is not diaphoretic. No erythema. No pallor.  Psychiatric: She has a normal mood and affect. Her behavior is normal. Judgment and thought content normal.          Assessment & Plan:

## 2012-07-09 NOTE — Assessment & Plan Note (Signed)
Secondary to acute exacerbation of congestive heart failure. Given that symptoms are persistent and she has some focal crackles in the left lower lobe on exam, will get a chest x-ray. Followup 2 weeks.

## 2012-07-09 NOTE — Patient Instructions (Signed)
Please increase Lasix (Furosemide) to 20mg  daily.  We will check labs today and get a chest xray.  Follow up 2 weeks.

## 2012-07-09 NOTE — Assessment & Plan Note (Signed)
Rate has been well controlled with carvedilol. Will continue. As previous notes, unclear why patient is not anticoagulated. CHADS2 score is 3. Will again request records from her cardiologist.

## 2012-07-09 NOTE — Assessment & Plan Note (Signed)
Patient with recent exacerbation of congestive heart failure. Encouraged her to take her Lasix 20 mg on a daily basis. Encouraged her to avoid intake of excessive salt. She will continue carvedilol 6.25 mg twice daily. We'll recheck a BNP today. Will also get a chest x-ray. If symptoms are persisting, would favor increasing Lasix to twice daily and repeating ECHO.

## 2012-07-15 ENCOUNTER — Inpatient Hospital Stay: Payer: Self-pay | Admitting: Family Medicine

## 2012-07-15 ENCOUNTER — Telehealth: Payer: Self-pay | Admitting: Internal Medicine

## 2012-07-15 LAB — BASIC METABOLIC PANEL
Anion Gap: 6 — ABNORMAL LOW (ref 7–16)
Calcium, Total: 9.2 mg/dL (ref 8.5–10.1)
Chloride: 93 mmol/L — ABNORMAL LOW (ref 98–107)
Co2: 28 mmol/L (ref 21–32)
EGFR (Non-African Amer.): 42 — ABNORMAL LOW
Glucose: 92 mg/dL (ref 65–99)
Osmolality: 259 (ref 275–301)
Potassium: 4.5 mmol/L (ref 3.5–5.1)
Sodium: 127 mmol/L — ABNORMAL LOW (ref 136–145)

## 2012-07-15 LAB — CBC
HCT: 31.7 % — ABNORMAL LOW (ref 40.0–52.0)
MCHC: 33.9 g/dL (ref 32.0–36.0)
MCV: 84 fL (ref 80–100)
Platelet: 252 10*3/uL (ref 150–440)
RBC: 3.78 10*6/uL — ABNORMAL LOW (ref 4.40–5.90)
RDW: 13.6 % (ref 11.5–14.5)
WBC: 11.5 10*3/uL — ABNORMAL HIGH (ref 3.8–10.6)

## 2012-07-15 LAB — PRO B NATRIURETIC PEPTIDE: B-Type Natriuretic Peptide: 7291 pg/mL — ABNORMAL HIGH (ref 0–125)

## 2012-07-15 NOTE — Telephone Encounter (Signed)
Patient is needing a mobility chair . The daughter is wanting someone to call her in reference to mobility testing.

## 2012-07-15 NOTE — Telephone Encounter (Signed)
In the past, a representative from the company has come with the pt to their visit, as specific things need to be addressed. We can set up a visit for this.

## 2012-07-16 LAB — BASIC METABOLIC PANEL
Calcium, Total: 8.7 mg/dL (ref 8.5–10.1)
EGFR (African American): 48 — ABNORMAL LOW
EGFR (Non-African Amer.): 41 — ABNORMAL LOW
Glucose: 147 mg/dL — ABNORMAL HIGH (ref 65–99)
Osmolality: 263 (ref 275–301)
Sodium: 127 mmol/L — ABNORMAL LOW (ref 136–145)

## 2012-07-16 LAB — CBC WITH DIFFERENTIAL/PLATELET
Basophil #: 0 10*3/uL (ref 0.0–0.1)
Basophil %: 0.2 %
Eosinophil #: 0 10*3/uL (ref 0.0–0.7)
Lymphocyte %: 15.1 %
MCH: 29.1 pg (ref 26.0–34.0)
MCHC: 34.9 g/dL (ref 32.0–36.0)
MCV: 84 fL (ref 80–100)
Monocyte #: 0.1 10*3/uL — ABNORMAL LOW (ref 0.2–1.0)
Monocyte %: 1.7 %
Neutrophil %: 82.7 %
Platelet: 235 10*3/uL (ref 150–440)
RBC: 3.7 10*6/uL — ABNORMAL LOW (ref 4.40–5.90)

## 2012-07-16 LAB — SODIUM, URINE, RANDOM: Sodium, Urine Random: 9 mmol/L (ref 20–110)

## 2012-07-16 LAB — OSMOLALITY, URINE: Osmolality: 407 mOsm/kg

## 2012-07-16 NOTE — Telephone Encounter (Signed)
Pt has been hospitalized. Visit will wait until discharge.

## 2012-07-17 ENCOUNTER — Telehealth: Payer: Self-pay | Admitting: Internal Medicine

## 2012-07-17 LAB — CBC WITH DIFFERENTIAL/PLATELET
Basophil #: 0 10*3/uL (ref 0.0–0.1)
Basophil %: 0.2 %
Eosinophil #: 0 10*3/uL (ref 0.0–0.7)
HCT: 30 % — ABNORMAL LOW (ref 40.0–52.0)
HGB: 10.2 g/dL — ABNORMAL LOW (ref 12.0–16.0)
Lymphocyte #: 1.8 10*3/uL (ref 1.0–3.6)
MCHC: 33.9 g/dL (ref 32.0–36.0)
MCV: 83 fL (ref 80–100)
Monocyte #: 0.8 10*3/uL (ref 0.2–1.0)
Monocyte %: 6.6 %
Neutrophil #: 9.6 10*3/uL — ABNORMAL HIGH (ref 1.4–6.5)
Platelet: 273 10*3/uL (ref 150–440)
RDW: 13.7 % (ref 11.5–14.5)
WBC: 12.3 10*3/uL — ABNORMAL HIGH (ref 3.8–10.6)

## 2012-07-17 LAB — BASIC METABOLIC PANEL
Anion Gap: 9 (ref 7–16)
BUN: 44 mg/dL — ABNORMAL HIGH (ref 7–18)
Co2: 26 mmol/L (ref 21–32)
Creatinine: 1.16 mg/dL (ref 0.60–1.30)
EGFR (African American): 44 — ABNORMAL LOW
EGFR (Non-African Amer.): 38 — ABNORMAL LOW
Glucose: 109 mg/dL — ABNORMAL HIGH (ref 65–99)
Sodium: 126 mmol/L — ABNORMAL LOW (ref 136–145)

## 2012-07-17 LAB — PRO B NATRIURETIC PEPTIDE: B-Type Natriuretic Peptide: 4706 pg/mL — ABNORMAL HIGH (ref 0–125)

## 2012-07-17 NOTE — Telephone Encounter (Signed)
Scheduled Pt for 07/31/12 at 2:15 pm. I called Hospital and let Velna Hatchet know.

## 2012-07-17 NOTE — Telephone Encounter (Signed)
Waiting on discharge paperwork from hospital.

## 2012-07-17 NOTE — Telephone Encounter (Signed)
[facility] is calling and wanting to schedule a 30 min hospital f/u for COPD and Dr. Tilman Neat schedule is so packed I wanted to ask before putting her in some where. Where should I put this pt ????  Best number Velna Hatchet [phone] They are trying to Discharge pt now.

## 2012-07-17 NOTE — Telephone Encounter (Signed)
Can we put her in the day after Christmas?  Shanda Bumps needs to call this pt today or tomorrow, with a copy of the DC summary in hand, to review her hospital course and plan of care, with documentation in a telephone note.

## 2012-07-18 NOTE — Telephone Encounter (Signed)
HH nurse notified

## 2012-07-18 NOTE — Telephone Encounter (Signed)
Denise from [facility] called wanting a verbal order from Dr. Dan Humphreys for Home Health Orders. Please call back at [phone].

## 2012-07-18 NOTE — Telephone Encounter (Signed)
Fine for verbal order for home health.

## 2012-07-22 ENCOUNTER — Telehealth: Payer: Self-pay | Admitting: Internal Medicine

## 2012-07-22 NOTE — Telephone Encounter (Signed)
[facility] is calling and wanting to know if they could add Assistance with dressing and grooming to his physical therapy. Best call back number [phone].

## 2012-07-22 NOTE — Telephone Encounter (Signed)
That is fine 

## 2012-07-22 NOTE — Telephone Encounter (Signed)
Home Health notified.

## 2012-07-23 ENCOUNTER — Encounter: Payer: Self-pay | Admitting: Internal Medicine

## 2012-07-23 ENCOUNTER — Telehealth: Payer: Self-pay | Admitting: Internal Medicine

## 2012-07-23 ENCOUNTER — Ambulatory Visit (INDEPENDENT_AMBULATORY_CARE_PROVIDER_SITE_OTHER): Payer: Medicare Other | Admitting: Internal Medicine

## 2012-07-23 VITALS — BP 122/62 | HR 82 | Temp 97.7°F | Resp 17 | Ht 60.0 in | Wt 114.5 lb

## 2012-07-23 DIAGNOSIS — R0609 Other forms of dyspnea: Secondary | ICD-10-CM

## 2012-07-23 DIAGNOSIS — R2681 Unsteadiness on feet: Secondary | ICD-10-CM | POA: Insufficient documentation

## 2012-07-23 DIAGNOSIS — J449 Chronic obstructive pulmonary disease, unspecified: Secondary | ICD-10-CM

## 2012-07-23 DIAGNOSIS — I509 Heart failure, unspecified: Secondary | ICD-10-CM

## 2012-07-23 DIAGNOSIS — R531 Weakness: Secondary | ICD-10-CM | POA: Insufficient documentation

## 2012-07-23 DIAGNOSIS — R5383 Other fatigue: Secondary | ICD-10-CM

## 2012-07-23 DIAGNOSIS — R0600 Dyspnea, unspecified: Secondary | ICD-10-CM

## 2012-07-23 DIAGNOSIS — R269 Unspecified abnormalities of gait and mobility: Secondary | ICD-10-CM

## 2012-07-23 LAB — COMPREHENSIVE METABOLIC PANEL
ALT: 11 U/L (ref 0–35)
AST: 17 U/L (ref 0–37)
Albumin: 3.6 g/dL (ref 3.5–5.2)
BUN: 40 mg/dL — ABNORMAL HIGH (ref 6–23)
CO2: 30 mEq/L (ref 19–32)
Calcium: 9.6 mg/dL (ref 8.4–10.5)
Chloride: 94 mEq/L — ABNORMAL LOW (ref 96–112)
Creatinine, Ser: 1.3 mg/dL — ABNORMAL HIGH (ref 0.4–1.2)
GFR: 40.25 mL/min — ABNORMAL LOW (ref >60.00)
Potassium: 4.7 mEq/L (ref 3.5–5.1)

## 2012-07-23 LAB — CBC WITH DIFFERENTIAL/PLATELET
Basophils Absolute: 0 10*3/uL (ref 0.0–0.1)
Eosinophils Absolute: 0.4 10*3/uL (ref 0.0–0.7)
Hemoglobin: 12 g/dL (ref 12.0–15.0)
Lymphocytes Relative: 15.9 % (ref 12.0–46.0)
MCHC: 33.1 g/dL (ref 30.0–36.0)
Monocytes Relative: 0.9 % — ABNORMAL LOW (ref 3.0–12.0)
Neutrophils Relative %: 79.5 % — ABNORMAL HIGH (ref 43.0–77.0)
RDW: 14.3 % (ref 11.5–14.6)

## 2012-07-23 LAB — BRAIN NATRIURETIC PEPTIDE: Pro B Natriuretic peptide (BNP): 286 pg/mL — ABNORMAL HIGH (ref 0.0–100.0)

## 2012-07-23 MED ORDER — LISINOPRIL 10 MG PO TABS: 10.0000 mg | ORAL_TABLET | Freq: Every day | ORAL | Status: DC

## 2012-07-23 NOTE — Assessment & Plan Note (Signed)
Dyspnea is improved after diuresis and treatment with steroids. Exam is normal today. We'll continue to monitor. Followup one week.

## 2012-07-23 NOTE — Assessment & Plan Note (Addendum)
Status post recent hospitalization for CHF exacerbation and COPD exacerbation. Appears euvolemic on exam today. Labs suggest that she is slightly dehydrated. BNP is improved compared to hospitalization. Will continue Lasix daily. We'll continue lisinopril. Plan to repeat renal function next week. Followup one week.  Over spent face to face with pt and her daughter discussing recent hospitalization, plan of care for CHF and COPD and mobility evaluation.

## 2012-07-23 NOTE — Assessment & Plan Note (Signed)
Elderly female with gait instability and and weakness. She also has functional limitation secondary to degenerative arthritis in her spine, arms, hands, and legs. She requires assistance for dressing, grooming, and bathing. A cane or Wendy Sims is inadequate because of her unsteady gait and recurrent falls as well as poor balance. She is unable to operate a manual wheelchair because of grip strength less than 2/5 in her hands. She also has decreased range of motion in her shoulders and joints. She is unable to operate a scooter because she lacks postural stability in her home environment is not provide adequate access for maneuvering a scooter. I feel that a power wheelchair would improve her ability to complete activities of daily living such as getting from the bed to the restroom. He would also enable her to have more quality interaction with her family and participate in activities outside the home. She has physical and mental ability to operate a wheelchair safely in her home and outside the home. She is willing and motivated to use a power wheelchair.

## 2012-07-23 NOTE — Assessment & Plan Note (Signed)
Ongoing fatigue after recent hospitalization for CHF exacerbation and COPD exacerbation. Labs reflect mild dehydration. We'll reduce Lasix to once daily. CBC was normal except for slight elevation of WBC likely secondary to prednisone use. Will continue to monitor. Follow up 1 week.

## 2012-07-23 NOTE — Telephone Encounter (Signed)
Ptis needing to know how many times a day she needs to do Albuteral inhaler.

## 2012-07-23 NOTE — Progress Notes (Signed)
Subjective:    Patient ID: Wendy Sims, female    DOB: 10-Oct-1908, 78 y.o.   MRN: [id number]  HPI 78 year old female with history of congestive heart failure, COPD, osteoarthritis presents for followup after recent hospitalization for CHF exacerbation and COPD exacerbation. She reports symptoms of shortness of breath have improved after treatment with prednisone and diuretics. She reports some ongoing fatigue after her hospitalization. She denies chest pain, palpitations. She does have some shortness of breath with minimal exertion, such as walking less than 10 feet. She does report that her appetite is good. She has been compliant with her medications. She continues to live at an assisted living facility. She also spends time with her daughter and son-in-law.  She also presents for a mobility examination. She is interested in obtaining a motorized wheelchair to better allow her to perform activities of daily living. She feels that she would be able to operate powered wheelchair without difficulty. She is not able to operate a standard wheelchair because of severe osteoarthritis in her hands which limit her grip strength. She also has generalized weakness in her shoulders and arms. She has attempted to propel a wheelchair in the past and been unsuccessful. She is not able to use a scooter because of instability in her trunk. She has had several falls at home. Over the last few months, her congestive heart failure and COPD have limited her ability to ambulate more than 10 feet without shortness of breath. She feels that a motorized scooter would allow her to perform activities such as moving from her bedroom to her bathroom.    Outpatient Encounter Prescriptions as of 07/23/2012  Medication Sig Dispense Refill  . albuterol (PROVENTIL) (2.5 MG/3ML) 0.083% nebulizer solution Take 2.5 mg by nebulization 4 (four) times daily.       . Calcium Polycarbophil (FIBER) 625 MG TABS Take by mouth daily.       . carvedilol (COREG) 6.25 MG tablet Take 1 tablet (6.25 mg total) by mouth 2 (two) times daily.  180 tablet  3  . furosemide (LASIX) 20 MG tablet Take 1 tablet (20 mg total) by mouth daily.  30 tablet  6  . lisinopril (PRINIVIL,ZESTRIL) 10 MG tablet Take 1 tablet (10 mg total) by mouth daily.  30 tablet  6  . vitamin B-12 (CYANOCOBALAMIN) 500 MCG tablet Take 500 mcg by mouth daily.      Marland Kitchen zolpidem (AMBIEN) 5 MG tablet Take 2.5 mg by mouth at bedtime as needed.       BP 122/62  Pulse 82  Temp 97.7 F (36.5 C) (Oral)  Resp 17  Wt 114 lb 8 oz (51.937 kg)  SpO2 95%  Review of Systems  Constitutional: Positive for fatigue. Negative for fever, chills, appetite change and unexpected weight change.  HENT: Negative for ear pain, congestion, sore throat, trouble swallowing, neck pain, voice change and sinus pressure.   Eyes: Negative for visual disturbance.  Respiratory: Positive for shortness of breath. Negative for cough, wheezing and stridor.   Cardiovascular: Negative for chest pain, palpitations and leg swelling.  Gastrointestinal: Negative for nausea, vomiting, abdominal pain, diarrhea, constipation, blood in stool, abdominal distention and anal bleeding.  Genitourinary: Negative for dysuria and flank pain.  Musculoskeletal: Positive for myalgias, back pain and arthralgias. Negative for gait problem.  Skin: Negative for color change and rash.  Neurological: Positive for weakness. Negative for dizziness and headaches.  Hematological: Negative for adenopathy. Does not bruise/bleed easily.  Psychiatric/Behavioral: Negative for suicidal ideas, sleep disturbance  and dysphoric mood. The patient is not nervous/anxious.        Objective:   Physical Exam  Constitutional: She is oriented to person, place, and time. She appears well-developed and well-nourished. No distress.  HENT:  Head: Normocephalic and atraumatic.  Right Ear: External ear normal.  Left Ear: External ear normal.  Nose:  Nose normal.  Mouth/Throat: Oropharynx is clear and moist. No oropharyngeal exudate.  Eyes: Conjunctivae normal are normal. Pupils are equal, round, and reactive to light. Right eye exhibits no discharge. Left eye exhibits no discharge. No scleral icterus.  Neck: Normal range of motion. Neck supple. No tracheal deviation present. No thyromegaly present.  Cardiovascular: Normal rate, regular rhythm and intact distal pulses.  Exam reveals no gallop and no friction rub.   Murmur heard. Pulmonary/Chest: Effort normal and breath sounds normal. No accessory muscle usage. Not tachypneic. No respiratory distress. She has no decreased breath sounds. She has no wheezes. She has no rhonchi. She has no rales. She exhibits no tenderness.  Musculoskeletal: She exhibits no edema and no tenderness.       Right elbow: She exhibits decreased range of motion.       Left elbow: She exhibits decreased range of motion.       Right wrist: She exhibits decreased range of motion.       Left wrist: She exhibits decreased range of motion.       Cervical back: She exhibits decreased range of motion, bony tenderness, deformity (kyphosis) and pain.       Thoracic back: She exhibits decreased range of motion, tenderness, bony tenderness and pain.       Lumbar back: She exhibits decreased range of motion, bony tenderness and pain. Deformity: kyphosis.       Right hand: She exhibits decreased range of motion, tenderness and bony tenderness. Decreased strength (3/5) noted. She exhibits finger abduction, thumb/finger opposition and wrist extension trouble.       Left hand: She exhibits decreased range of motion and tenderness. Decreased strength (3/5) noted. She exhibits finger abduction and thumb/finger opposition.       Strength bilateral LE 4/5 lower leg flexor/ext, 4/5 hip abd.  Gait is unstable with occasional shuffling of feet.  Pt requires assistance to stand from seated position.   Pt can walk approximately 25yards with  assitance of walker, however this is significantly limited by shortness of breath, gait instability.  Lymphadenopathy:    She has no cervical adenopathy.  Neurological: She is alert and oriented to person, place, and time. No cranial nerve deficit. She exhibits normal muscle tone. Coordination normal.  Skin: Skin is warm and dry. No rash noted. She is not diaphoretic. No erythema. No pallor.  Psychiatric: She has a normal mood and affect. Her behavior is normal. Judgment and thought content normal.          Assessment & Plan:

## 2012-07-23 NOTE — Assessment & Plan Note (Signed)
Symptomatically improved after treatment with steroid taper. We'll continue to monitor. Continue albuterol to 4 times daily as needed. Followup in one week for recheck.

## 2012-07-23 NOTE — Telephone Encounter (Signed)
Called left message to advise max of 4 times daily 2 puffs each time.

## 2012-07-31 ENCOUNTER — Ambulatory Visit (INDEPENDENT_AMBULATORY_CARE_PROVIDER_SITE_OTHER): Payer: Medicare Other | Admitting: Internal Medicine

## 2012-07-31 ENCOUNTER — Encounter: Payer: Self-pay | Admitting: Internal Medicine

## 2012-07-31 VITALS — BP 130/82 | HR 79 | Temp 97.9°F | Resp 16 | Wt 114.0 lb

## 2012-07-31 DIAGNOSIS — I509 Heart failure, unspecified: Secondary | ICD-10-CM

## 2012-07-31 DIAGNOSIS — B029 Zoster without complications: Secondary | ICD-10-CM | POA: Insufficient documentation

## 2012-07-31 MED ORDER — GABAPENTIN 100 MG PO CAPS: 100.0000 mg | ORAL_CAPSULE | Freq: Every day | ORAL | Status: DC

## 2012-07-31 MED ORDER — VALACYCLOVIR HCL 1 G PO TABS: 1000.0000 mg | ORAL_TABLET | Freq: Two times a day (BID) | ORAL | Status: DC

## 2012-07-31 NOTE — Assessment & Plan Note (Signed)
Symptomatically doing well. Has some lower extremity edema today which has increased from previous. Will increase dose of Lasix to twice daily for the next 3 days. Continue lisinopril and carvedilol. Continue daily weights. Followup in 2 weeks.

## 2012-07-31 NOTE — Progress Notes (Signed)
Subjective:    Patient ID: Wendy Sims, female    DOB: 1908/09/08, 78 y.o.   MRN: [id number]  HPI 78 year old female with history of congestive heart failure status post recent admission for CHF exacerbation presents for followup. In regards to heart failure, her daughter reports she is generally been doing well. She seems less short of breath. Over the last couple of days, she has had some increased swelling in her ankles. Her daughter increased her dose of Lasix to twice daily for one day. Her weight is up slightly from baseline of 110-114.  Patient is concerned today about a rash over her right upper back. Rash started approximately 2 days ago. It is described as extremely itchy. She has been applying warm compresses to the area with some improvement in symptoms. The itching has kept her awake at night. It is slightly painful. She denies any fever or chills. Notably, she previously had shingles at this site several years ago.  Outpatient Encounter Prescriptions as of 07/31/2012  Medication Sig Dispense Refill  . acetaminophen (TYLENOL) 650 MG CR tablet Take 650 mg by mouth 2 (two) times daily.      Marland Kitchen albuterol (PROVENTIL) (2.5 MG/3ML) 0.083% nebulizer solution Take 2.5 mg by nebulization 4 (four) times daily.       . Calcium Polycarbophil (FIBER) 625 MG TABS Take by mouth daily.      . carvedilol (COREG) 6.25 MG tablet Take 1 tablet (6.25 mg total) by mouth 2 (two) times daily.  180 tablet  3  . furosemide (LASIX) 20 MG tablet Take 1 tablet (20 mg total) by mouth daily.  30 tablet  6  . gabapentin (NEURONTIN) 100 MG capsule Take 1 capsule (100 mg total) by mouth at bedtime.  30 capsule  3  . lisinopril (PRINIVIL,ZESTRIL) 10 MG tablet Take 1 tablet (10 mg total) by mouth daily.  30 tablet  6  . valACYclovir (VALTREX) 1000 MG tablet Take 1 tablet (1,000 mg total) by mouth 2 (two) times daily.  20 tablet  0  . vitamin B-12 (CYANOCOBALAMIN) 500 MCG tablet Take 500 mcg by mouth daily.      Marland Kitchen  zolpidem (AMBIEN) 5 MG tablet Take 2.5 mg by mouth at bedtime as needed.       BP 130/82  Pulse 79  Temp 97.9 F (36.6 C) (Oral)  Resp 16  Wt 114 lb (51.71 kg)  SpO2 98%  Review of Systems  Constitutional: Negative for fever, chills, appetite change, fatigue and unexpected weight change.  HENT: Negative for ear pain, congestion, sore throat, trouble swallowing, neck pain, voice change and sinus pressure.   Eyes: Negative for visual disturbance.  Respiratory: Negative for cough, shortness of breath, wheezing and stridor.   Cardiovascular: Positive for leg swelling. Negative for chest pain and palpitations.  Gastrointestinal: Negative for nausea, vomiting, abdominal pain, diarrhea, constipation, blood in stool, abdominal distention and anal bleeding.  Genitourinary: Negative for dysuria and flank pain.  Musculoskeletal: Negative for myalgias, arthralgias and gait problem.  Skin: Positive for color change and rash.  Neurological: Negative for dizziness and headaches.  Hematological: Negative for adenopathy. Does not bruise/bleed easily.  Psychiatric/Behavioral: Negative for suicidal ideas, sleep disturbance and dysphoric mood. The patient is not nervous/anxious.        Objective:   Physical Exam  Constitutional: She is oriented to person, place, and time. She appears well-developed and well-nourished. No distress.  HENT:  Head: Normocephalic and atraumatic.  Right Ear: External ear normal.  Left Ear:  External ear normal.  Nose: Nose normal.  Mouth/Throat: Oropharynx is clear and moist. No oropharyngeal exudate.  Eyes: Conjunctivae normal are normal. Pupils are equal, round, and reactive to light. Right eye exhibits no discharge. Left eye exhibits no discharge. No scleral icterus.  Neck: Normal range of motion. Neck supple. No tracheal deviation present. No thyromegaly present.  Cardiovascular: Normal rate, regular rhythm, normal heart sounds and intact distal pulses.  Exam reveals  no gallop and no friction rub.   No murmur heard. Pulmonary/Chest: Effort normal and breath sounds normal. No respiratory distress. She has no wheezes. She has no rales. She exhibits no tenderness.  Musculoskeletal: Normal range of motion. She exhibits edema (pitting bilateral ankles). She exhibits no tenderness.  Lymphadenopathy:    She has no cervical adenopathy.  Neurological: She is alert and oriented to person, place, and time. No cranial nerve deficit. She exhibits normal muscle tone. Coordination normal.  Skin: Skin is warm and dry. Rash noted. Rash is vesicular. She is not diaphoretic. There is erythema. No pallor.     Psychiatric: She has a normal mood and affect. Her behavior is normal. Judgment and thought content normal.          Assessment & Plan:

## 2012-07-31 NOTE — Assessment & Plan Note (Signed)
Rash over her right posterior back is most consistent with shingles. Will treat with Valtrex twice daily x10 days and Neurontin 100 mg at bedtime for itching. Will titrate Neurontin dose as tolerated. Patient's daughter will call with an update on Friday.

## 2012-08-04 ENCOUNTER — Encounter: Payer: Self-pay | Admitting: Internal Medicine

## 2012-08-07 ENCOUNTER — Telehealth: Payer: Self-pay | Admitting: Internal Medicine

## 2012-08-07 NOTE — Telephone Encounter (Signed)
Wendy Sims left voicemail on 08/04/12  checking the status of paperwork.  If you call her back please use ref # [id number]

## 2012-08-11 DIAGNOSIS — I4891 Unspecified atrial fibrillation: Secondary | ICD-10-CM

## 2012-08-11 DIAGNOSIS — I509 Heart failure, unspecified: Secondary | ICD-10-CM

## 2012-08-11 DIAGNOSIS — J441 Chronic obstructive pulmonary disease with (acute) exacerbation: Secondary | ICD-10-CM

## 2012-08-12 NOTE — Telephone Encounter (Signed)
Paperwork has been submitted three times. Will try again this afternoon.

## 2012-08-18 ENCOUNTER — Encounter: Payer: Self-pay | Admitting: Internal Medicine

## 2012-08-18 ENCOUNTER — Ambulatory Visit (INDEPENDENT_AMBULATORY_CARE_PROVIDER_SITE_OTHER): Payer: Medicare Other | Admitting: Internal Medicine

## 2012-08-18 VITALS — BP 148/108 | HR 94 | Wt 108.0 lb

## 2012-08-18 DIAGNOSIS — I4891 Unspecified atrial fibrillation: Secondary | ICD-10-CM

## 2012-08-18 DIAGNOSIS — R0600 Dyspnea, unspecified: Secondary | ICD-10-CM

## 2012-08-18 DIAGNOSIS — N182 Chronic kidney disease, stage 2 (mild): Secondary | ICD-10-CM

## 2012-08-18 DIAGNOSIS — I10 Essential (primary) hypertension: Secondary | ICD-10-CM

## 2012-08-18 DIAGNOSIS — R0989 Other specified symptoms and signs involving the circulatory and respiratory systems: Secondary | ICD-10-CM

## 2012-08-18 NOTE — Assessment & Plan Note (Signed)
Rate well controlled. Not on anticoagulation, CHADS2 score is 3. High risk of falls with gait instability, so will continue off anticoagulation.

## 2012-08-18 NOTE — Assessment & Plan Note (Signed)
BP slightly elevated today, but pt reports has been well controlled at home. Will continue current medications. Follow up 3 months and prn.

## 2012-08-18 NOTE — Assessment & Plan Note (Addendum)
Will recheck renal function with labs today. 

## 2012-08-18 NOTE — Progress Notes (Signed)
Subjective:    Patient ID: Wendy Sims, female    DOB: 06-15-1909, 79 y.o.   MRN: [id number]  HPI 79YO female with AFIB, CHF, CKD3 presents for follow up. Doing well. No recurrent episodes of dyspnea. Continues to have some mild fatigue, and poor appetite. No focal symptoms such as chest pain or palpitations. No new concerns today.  Outpatient Encounter Prescriptions as of 08/18/2012  Medication Sig Dispense Refill  . acetaminophen (TYLENOL) 650 MG CR tablet Take 650 mg by mouth 2 (two) times daily.      Marland Kitchen albuterol (PROVENTIL) (2.5 MG/3ML) 0.083% nebulizer solution Take 2.5 mg by nebulization 4 (four) times daily.       . Calcium Polycarbophil (FIBER) 625 MG TABS Take by mouth daily.      . carvedilol (COREG) 6.25 MG tablet Take 1 tablet (6.25 mg total) by mouth 2 (two) times daily.  180 tablet  3  . furosemide (LASIX) 20 MG tablet Take 1 tablet (20 mg total) by mouth daily.  30 tablet  6  . lisinopril (PRINIVIL,ZESTRIL) 10 MG tablet Take 1 tablet (10 mg total) by mouth daily.  30 tablet  6  . vitamin B-12 (CYANOCOBALAMIN) 500 MCG tablet Take 500 mcg by mouth daily.      Marland Kitchen zolpidem (AMBIEN) 5 MG tablet Take 2.5 mg by mouth at bedtime as needed.       BP 148/108  Pulse 94  Wt 108 lb (48.988 kg)  SpO2 97%  Review of Systems  Constitutional: Positive for fatigue. Negative for fever, chills, appetite change and unexpected weight change.  HENT: Negative for ear pain, congestion, sore throat, trouble swallowing, neck pain, voice change and sinus pressure.   Eyes: Negative for visual disturbance.  Respiratory: Negative for cough, shortness of breath, wheezing and stridor.   Cardiovascular: Negative for chest pain, palpitations and leg swelling.  Gastrointestinal: Negative for nausea, vomiting, abdominal pain, diarrhea, constipation, blood in stool, abdominal distention and anal bleeding.  Genitourinary: Negative for dysuria and flank pain.  Musculoskeletal: Positive for gait problem.  Negative for myalgias and arthralgias.  Skin: Negative for color change and rash.  Neurological: Negative for dizziness and headaches.  Hematological: Negative for adenopathy. Does not bruise/bleed easily.  Psychiatric/Behavioral: Negative for suicidal ideas, sleep disturbance and dysphoric mood. The patient is not nervous/anxious.        Objective:   Physical Exam  Constitutional: She is oriented to person, place, and time. She appears well-developed and well-nourished. No distress.  HENT:  Head: Normocephalic and atraumatic.  Right Ear: External ear normal.  Left Ear: External ear normal.  Nose: Nose normal.  Mouth/Throat: Oropharynx is clear and moist. No oropharyngeal exudate.  Eyes: Conjunctivae normal are normal. Pupils are equal, round, and reactive to light. Right eye exhibits no discharge. Left eye exhibits no discharge. No scleral icterus.  Neck: Normal range of motion. Neck supple. No tracheal deviation present. No thyromegaly present.  Cardiovascular: Normal rate and intact distal pulses.  An irregularly irregular rhythm present. Exam reveals no gallop and no friction rub.   Murmur heard. Pulmonary/Chest: Effort normal and breath sounds normal. No respiratory distress. She has no wheezes. She has no rales. She exhibits no tenderness.  Musculoskeletal: Normal range of motion. She exhibits no edema and no tenderness.  Lymphadenopathy:    She has no cervical adenopathy.  Neurological: She is alert and oriented to person, place, and time. No cranial nerve deficit. She exhibits normal muscle tone. Coordination normal.  Skin: Skin is warm  and dry. No rash noted. She is not diaphoretic. No erythema. No pallor.  Psychiatric: She has a normal mood and affect. Her behavior is normal. Judgment and thought content normal.          Assessment & Plan:

## 2012-08-19 LAB — COMPREHENSIVE METABOLIC PANEL
ALT: 12 U/L (ref 0–35)
AST: 22 U/L (ref 0–37)
Albumin: 4.5 g/dL (ref 3.5–5.2)
Alkaline Phosphatase: 69 U/L (ref 39–117)
BUN: 41 mg/dL — ABNORMAL HIGH (ref 6–23)
Calcium: 10.3 mg/dL (ref 8.4–10.5)
Chloride: 99 mEq/L (ref 96–112)
Potassium: 5.5 mEq/L — ABNORMAL HIGH (ref 3.5–5.1)
Sodium: 134 mEq/L — ABNORMAL LOW (ref 135–145)
Total Protein: 8.1 g/dL (ref 6.0–8.3)

## 2012-08-25 NOTE — Telephone Encounter (Signed)
Paperwork re-faxed today by Dayna Barker.

## 2012-09-04 ENCOUNTER — Ambulatory Visit (INDEPENDENT_AMBULATORY_CARE_PROVIDER_SITE_OTHER): Payer: Medicare Other | Admitting: Internal Medicine

## 2012-09-04 ENCOUNTER — Encounter: Payer: Self-pay | Admitting: Internal Medicine

## 2012-09-04 VITALS — BP 160/80 | HR 86 | Temp 97.7°F | Wt 111.0 lb

## 2012-09-04 DIAGNOSIS — R269 Unspecified abnormalities of gait and mobility: Secondary | ICD-10-CM

## 2012-09-04 DIAGNOSIS — R2681 Unsteadiness on feet: Secondary | ICD-10-CM

## 2012-09-04 NOTE — Telephone Encounter (Signed)
Patient in today for visit to fill out corrections on paperwork.

## 2012-09-04 NOTE — Progress Notes (Signed)
Subjective:    Patient ID: Wendy Sims, female    DOB: 1909-05-08, 79 y.o.   MRN: [id number]  HPI  79 year old female with history of congestive heart failure, COPD, osteoarthritis presents for mobility examination.She is interested in obtaining a motorized wheelchair to better allow her to perform activities of daily living. She feels that she would be able to operate powered wheelchair without difficulty. She is not able to operate a standard wheelchair because of severe osteoarthritis in her hands which limit her grip strength and range of motion. She also has generalized weakness and limited range of motion in her shoulders and arms. She has attempted to propel a wheelchair in the past and been unsuccessful. She is not able to use a scooter because of instability in her trunk and limited range of motion. She has had several falls at home and has poor balance. Over the last few months, her congestive heart failure and COPD have limited her ability to ambulate more than 10 feet without shortness of breath. She feels that a motorized power wheelchair would allow her to perform activities such as moving from her bedroom to her bathroom. A scooter would not suffice because of limited space in her home environment.   Outpatient Encounter Prescriptions as of 07/23/2012  Medication Sig Dispense Refill  . albuterol (PROVENTIL) (2.5 MG/3ML) 0.083% nebulizer solution Take 2.5 mg by nebulization 4 (four) times daily.       . Calcium Polycarbophil (FIBER) 625 MG TABS Take by mouth daily.      . carvedilol (COREG) 6.25 MG tablet Take 1 tablet (6.25 mg total) by mouth 2 (two) times daily.  180 tablet  3  . furosemide (LASIX) 20 MG tablet Take 1 tablet (20 mg total) by mouth daily.  30 tablet  6  . lisinopril (PRINIVIL,ZESTRIL) 10 MG tablet Take 1 tablet (10 mg total) by mouth daily.  30 tablet  6  . vitamin B-12 (CYANOCOBALAMIN) 500 MCG tablet Take 500 mcg by mouth daily.      Marland Kitchen zolpidem (AMBIEN) 5 MG  tablet Take 2.5 mg by mouth at bedtime as needed.       BP 160/80  Pulse 86  Temp 97.7 F (36.5 C) (Oral)  Wt 111 lb (50.349 kg)  SpO2 96%  Review of Systems  Constitutional: Negative for fever, chills, appetite change, fatigue and unexpected weight change.  HENT: Negative for ear pain, congestion, sore throat, trouble swallowing, neck pain, voice change and sinus pressure.   Eyes: Negative for visual disturbance.  Respiratory: Negative for cough, shortness of breath, wheezing and stridor.   Cardiovascular: Negative for chest pain, palpitations and leg swelling.  Gastrointestinal: Negative for nausea, vomiting, abdominal pain, diarrhea, constipation, blood in stool, abdominal distention and anal bleeding.  Genitourinary: Negative for dysuria and flank pain.  Musculoskeletal: Positive for myalgias, back pain, arthralgias and gait problem.  Skin: Negative for color change and rash.  Neurological: Positive for weakness. Negative for dizziness and headaches.  Hematological: Negative for adenopathy. Does not bruise/bleed easily.  Psychiatric/Behavioral: Negative for suicidal ideas, sleep disturbance and dysphoric mood. The patient is not nervous/anxious.        Objective:   Physical Exam  Constitutional: She is oriented to person, place, and time. She appears well-developed and well-nourished. No distress.  HENT:  Head: Normocephalic and atraumatic.  Right Ear: External ear normal.  Left Ear: External ear normal.  Nose: Nose normal.  Mouth/Throat: Oropharynx is clear and moist. No oropharyngeal exudate.  Eyes: Conjunctivae normal  are normal. Pupils are equal, round, and reactive to light. Right eye exhibits no discharge. Left eye exhibits no discharge. No scleral icterus.  Neck: Normal range of motion. Neck supple. No tracheal deviation present. No thyromegaly present.  Cardiovascular: Normal rate, regular rhythm and intact distal pulses.  Exam reveals no gallop and no friction rub.    Murmur heard. Pulmonary/Chest: Effort normal and breath sounds normal. No accessory muscle usage. Not tachypneic. No respiratory distress. She has no decreased breath sounds. She has no wheezes. She has no rhonchi. She has no rales. She exhibits no tenderness.  Musculoskeletal: She exhibits no edema and no tenderness.       Right elbow: She exhibits decreased range of motion.       Left elbow: She exhibits decreased range of motion.       Right wrist: She exhibits decreased range of motion.       Left wrist: She exhibits decreased range of motion.       Cervical back: She exhibits decreased range of motion, bony tenderness, deformity (kyphosis) and pain.       Thoracic back: She exhibits decreased range of motion, tenderness, bony tenderness and pain.       Lumbar back: She exhibits decreased range of motion, bony tenderness and pain. Deformity: kyphosis.       Right hand: She exhibits decreased range of motion, tenderness, bony tenderness and deformity (contractures from arthritis). Decreased strength (3/5) noted. She exhibits finger abduction, thumb/finger opposition and wrist extension trouble.       Left hand: She exhibits decreased range of motion, tenderness and deformity (contractures from arthritis). Decreased strength (3/5) noted. She exhibits finger abduction and thumb/finger opposition.       Strength bilateral LE 4/5 lower leg flexor/ext, 4/5 hip abd.  Gait is unstable with occasional shuffling of feet.  Pt requires assistance to stand from seated position.   Pt can walk approximately 25yards with assitance of walker, however this is significantly limited by shortness of breath, gait instability. Shortness of breath occurs at 10 feet.  Lymphadenopathy:    She has no cervical adenopathy.  Neurological: She is alert and oriented to person, place, and time. No cranial nerve deficit. She exhibits normal muscle tone. Coordination normal.  Skin: Skin is warm and dry. No rash noted. She  is not diaphoretic. No erythema. No pallor.  Psychiatric: She has a normal mood and affect. Her behavior is normal. Judgment and thought content normal.          Assessment & Plan:

## 2012-09-04 NOTE — Assessment & Plan Note (Signed)
Elderly female with gait instability and and weakness. She also has functional limitation secondary to degenerative arthritis in her spine, arms, hands, and legs. She requires assistance for dressing, grooming, and bathing. A cane or Zackrey Dyar is inadequate because of her unsteady gait and recurrent falls as well as poor balance. She is unable to operate a manual wheelchair because of grip strength less than 3/5 in her handsj and very poor range of motion with contractures. She is unable to operate a scooter because she lacks postural stability and her home environment is not provide adequate access for maneuvering a scooter. I feel that a power wheelchair would improve her ability to complete activities of daily living such as getting from the bed to the restroom. He would also enable her to have more quality interaction with her family and participate in activities outside the home. She has physical and mental ability to operate a wheelchair safely in her home and outside the home. She is willing and motivated to use a power wheelchair.

## 2012-09-09 ENCOUNTER — Other Ambulatory Visit: Payer: Self-pay | Admitting: *Deleted

## 2012-09-09 DIAGNOSIS — R609 Edema, unspecified: Secondary | ICD-10-CM

## 2012-09-09 MED ORDER — FUROSEMIDE 20 MG PO TABS: 20.0000 mg | ORAL_TABLET | Freq: Every day | ORAL | Status: DC

## 2012-09-09 NOTE — Telephone Encounter (Signed)
Med filled.  

## 2012-09-24 ENCOUNTER — Ambulatory Visit: Payer: Medicare Other | Admitting: Internal Medicine

## 2012-11-17 ENCOUNTER — Encounter: Payer: Self-pay | Admitting: Internal Medicine

## 2012-11-17 ENCOUNTER — Ambulatory Visit (INDEPENDENT_AMBULATORY_CARE_PROVIDER_SITE_OTHER): Payer: Medicare Other | Admitting: Internal Medicine

## 2012-11-17 VITALS — BP 130/74 | HR 74 | Temp 97.7°F | Wt 110.0 lb

## 2012-11-17 DIAGNOSIS — I10 Essential (primary) hypertension: Secondary | ICD-10-CM

## 2012-11-17 DIAGNOSIS — R609 Edema, unspecified: Secondary | ICD-10-CM

## 2012-11-17 DIAGNOSIS — I509 Heart failure, unspecified: Secondary | ICD-10-CM

## 2012-11-17 LAB — COMPREHENSIVE METABOLIC PANEL
AST: 16 U/L (ref 0–37)
Albumin: 4.1 g/dL (ref 3.5–5.2)
Alkaline Phosphatase: 64 U/L (ref 39–117)
BUN: 35 mg/dL — ABNORMAL HIGH (ref 6–23)
Creatinine, Ser: 1.3 mg/dL — ABNORMAL HIGH (ref 0.4–1.2)
Glucose, Bld: 86 mg/dL (ref 70–99)
Potassium: 5.2 mEq/L — ABNORMAL HIGH (ref 3.5–5.1)

## 2012-11-17 MED ORDER — LISINOPRIL 10 MG PO TABS: 10.0000 mg | ORAL_TABLET | Freq: Every day | ORAL | Status: DC

## 2012-11-17 MED ORDER — FUROSEMIDE 20 MG PO TABS: 20.0000 mg | ORAL_TABLET | Freq: Every day | ORAL | Status: DC

## 2012-11-17 MED ORDER — CARVEDILOL 6.25 MG PO TABS: 6.2500 mg | ORAL_TABLET | Freq: Two times a day (BID) | ORAL | Status: DC

## 2012-11-17 NOTE — Assessment & Plan Note (Signed)
Repeat renal function with labs today. 

## 2012-11-17 NOTE — Assessment & Plan Note (Signed)
Symptomatically doing well. Appears euvolemic on exam. Continue current medications.

## 2012-11-17 NOTE — Assessment & Plan Note (Signed)
BP Readings from Last 3 Encounters:  11/17/12 130/74  09/04/12 160/80  08/18/12 148/108   BP well controlled on current medication. Will continue. Repeat renal function with labs today.

## 2012-11-17 NOTE — Progress Notes (Signed)
Subjective:    Patient ID: Wendy Sims, female    DOB: 1908/12/16, 79 y.o.   MRN: [id number]  HPI 79 year old female with history of CHF, hypertension , arthritis presents for followup. She reports she is generally feeling well. She denies any recent shortness of breath, chest pain. She does have occasional swelling in her lower extremities. However, this is been well controlled with use of Lasix. She reports her appetite has been good. She denies any new concerns today.  Outpatient Encounter Prescriptions as of 11/17/2012  Medication Sig Dispense Refill  . acetaminophen (TYLENOL) 650 MG CR tablet Take 650 mg by mouth 2 (two) times daily.      Marland Kitchen albuterol (PROVENTIL) (2.5 MG/3ML) 0.083% nebulizer solution Take 2.5 mg by nebulization 4 (four) times daily.       Marland Kitchen aspirin 81 MG tablet Take 81 mg by mouth daily.      . Calcium Polycarbophil (FIBER) 625 MG TABS Take by mouth daily.      . carvedilol (COREG) 6.25 MG tablet Take 1 tablet (6.25 mg total) by mouth 2 (two) times daily.  180 tablet  3  . furosemide (LASIX) 20 MG tablet Take 1 tablet (20 mg total) by mouth daily.  90 tablet  4  . lisinopril (PRINIVIL,ZESTRIL) 10 MG tablet Take 1 tablet (10 mg total) by mouth daily.  90 tablet  4  . vitamin B-12 (CYANOCOBALAMIN) 500 MCG tablet Take 500 mcg by mouth daily.      Marland Kitchen zolpidem (AMBIEN) 5 MG tablet Take 2.5 mg by mouth at bedtime as needed.       No facility-administered encounter medications on file as of 11/17/2012.   BP 130/74  Pulse 74  Temp(Src) 97.7 F (36.5 C) (Oral)  Wt 110 lb (49.896 kg)  BMI 21.48 kg/m2  SpO2 92%  Review of Systems  Constitutional: Negative for fever, chills, appetite change, fatigue and unexpected weight change.  HENT: Negative for ear pain, congestion, sore throat, trouble swallowing, neck pain, voice change and sinus pressure.   Eyes: Negative for visual disturbance.  Respiratory: Negative for cough, shortness of breath, wheezing and stridor.    Cardiovascular: Positive for leg swelling. Negative for chest pain and palpitations.  Gastrointestinal: Negative for nausea, vomiting, abdominal pain, diarrhea, constipation, blood in stool, abdominal distention and anal bleeding.  Genitourinary: Negative for dysuria and flank pain.  Musculoskeletal: Positive for arthralgias (chronic). Negative for myalgias and gait problem.  Skin: Negative for color change and rash.  Neurological: Negative for dizziness and headaches.  Hematological: Negative for adenopathy. Does not bruise/bleed easily.  Psychiatric/Behavioral: Negative for suicidal ideas, sleep disturbance and dysphoric mood. The patient is not nervous/anxious.        Objective:   Physical Exam  Constitutional: She is oriented to person, place, and time. She appears well-developed and well-nourished. No distress.  HENT:  Head: Normocephalic and atraumatic.  Right Ear: External ear normal.  Left Ear: External ear normal.  Nose: Nose normal.  Mouth/Throat: Oropharynx is clear and moist. No oropharyngeal exudate.  Eyes: Conjunctivae are normal. Pupils are equal, round, and reactive to light. Right eye exhibits no discharge. Left eye exhibits no discharge. No scleral icterus.  Neck: Normal range of motion. Neck supple. No tracheal deviation present. No thyromegaly present.  Cardiovascular: Normal rate, regular rhythm, normal heart sounds and intact distal pulses.  Exam reveals no gallop and no friction rub.   No murmur heard. Pulmonary/Chest: Effort normal and breath sounds normal. No accessory muscle usage. Not tachypneic.  No respiratory distress. She has no decreased breath sounds. She has no wheezes. She has no rhonchi. She has no rales. She exhibits no tenderness.  Musculoskeletal: Normal range of motion. She exhibits edema (trace right ankle). She exhibits no tenderness.  Lymphadenopathy:    She has no cervical adenopathy.  Neurological: She is alert and oriented to person, place,  and time. No cranial nerve deficit. She exhibits normal muscle tone. Coordination normal.  Skin: Skin is warm and dry. No rash noted. She is not diaphoretic. No erythema. No pallor.  Psychiatric: She has a normal mood and affect. Her behavior is normal. Judgment and thought content normal.          Assessment & Plan:

## 2012-11-24 ENCOUNTER — Inpatient Hospital Stay: Payer: Self-pay | Admitting: Internal Medicine

## 2012-11-24 LAB — CBC WITH DIFFERENTIAL/PLATELET
Eosinophil #: 0.5 10*3/uL (ref 0.0–0.7)
Eosinophil %: 4 %
HCT: 38.4 % — ABNORMAL LOW (ref 40.0–52.0)
HGB: 12.7 g/dL (ref 12.0–16.0)
Lymphocyte #: 2 10*3/uL (ref 1.0–3.6)
Lymphocyte %: 15.1 %
MCH: 29.2 pg (ref 26.0–34.0)
MCHC: 33.2 g/dL (ref 32.0–36.0)
MCV: 88 fL (ref 80–100)
Monocyte %: 7 %
Neutrophil #: 9.7 10*3/uL — ABNORMAL HIGH (ref 1.4–6.5)
Neutrophil %: 73.5 %
Platelet: 204 10*3/uL (ref 150–440)
RDW: 14.4 % (ref 11.5–14.5)
WBC: 13.2 10*3/uL — ABNORMAL HIGH (ref 3.8–10.6)

## 2012-11-24 LAB — PROTIME-INR
INR: 1.1
Prothrombin Time: 14.1 secs (ref 11.5–14.7)

## 2012-11-24 LAB — LIPASE, BLOOD: Lipase: 208 U/L (ref 73–393)

## 2012-11-24 LAB — COMPREHENSIVE METABOLIC PANEL
Albumin: 3.8 g/dL (ref 3.4–5.0)
Alkaline Phosphatase: 97 U/L (ref 50–136)
Anion Gap: 7 (ref 7–16)
Creatinine: 1.25 mg/dL (ref 0.60–1.30)
EGFR (African American): 40 — ABNORMAL LOW
EGFR (Non-African Amer.): 35 — ABNORMAL LOW
Glucose: 98 mg/dL (ref 65–99)
Osmolality: 264 (ref 275–301)
Potassium: 4.7 mmol/L (ref 3.5–5.1)
SGOT(AST): 26 U/L (ref 15–37)
SGPT (ALT): 15 U/L (ref 12–78)
Total Protein: 8.2 g/dL (ref 6.4–8.2)

## 2012-11-24 LAB — CK TOTAL AND CKMB (NOT AT ARMC)
CK, Total: 53 U/L (ref 21–232)
CK-MB: 1.6 ng/mL (ref 0.5–3.6)

## 2012-11-25 ENCOUNTER — Telehealth: Payer: Self-pay | Admitting: Internal Medicine

## 2012-11-25 DIAGNOSIS — I369 Nonrheumatic tricuspid valve disorder, unspecified: Secondary | ICD-10-CM

## 2012-11-25 LAB — URINALYSIS, COMPLETE
Bilirubin,UR: NEGATIVE
Blood: NEGATIVE
Glucose,UR: NEGATIVE mg/dL (ref 0–75)
Hyaline Cast: 4
Ketone: NEGATIVE
Ph: 6 (ref 4.5–8.0)
RBC,UR: <1 /HPF (ref 0–5)
Specific Gravity: 1.008 (ref 1.003–1.030)
Squamous Epithelial: NONE SEEN

## 2012-11-25 LAB — BASIC METABOLIC PANEL
Anion Gap: 4 — ABNORMAL LOW (ref 7–16)
BUN: 34 mg/dL — ABNORMAL HIGH (ref 7–18)
Chloride: 92 mmol/L — ABNORMAL LOW (ref 98–107)
Co2: 28 mmol/L (ref 21–32)
EGFR (African American): 41 — ABNORMAL LOW
Osmolality: 257 (ref 275–301)
Sodium: 124 mmol/L — ABNORMAL LOW (ref 136–145)

## 2012-11-25 NOTE — Telephone Encounter (Signed)
Patient is in the hospital being treated for fluid around her heart.

## 2012-11-25 NOTE — Telephone Encounter (Signed)
Fwd to Dr. Walker 

## 2012-11-27 ENCOUNTER — Telehealth: Payer: Self-pay | Admitting: Internal Medicine

## 2012-11-27 NOTE — Telephone Encounter (Signed)
Patient has not gotten home from the hospital and per the gentleman that answered the phone, he stated call [phone]

## 2012-11-30 LAB — CULTURE, BLOOD (SINGLE)

## 2012-12-02 NOTE — Telephone Encounter (Signed)
Patient has been released from the hospital and has a follow up appointment scheduled for 4/30

## 2012-12-02 NOTE — Telephone Encounter (Signed)
Spoke with patient caregiver Steward Drone and she state patient is doing ok, trying to get used to using her Advair. Will be in to see Raquel tomorrow. Faxed [facility] for medical records.

## 2012-12-02 NOTE — Telephone Encounter (Signed)
No one available at number listed below and patient has follow up appointment scheduled for 4/30 with Raquel.

## 2012-12-03 ENCOUNTER — Other Ambulatory Visit: Payer: Self-pay | Admitting: Adult Health

## 2012-12-03 ENCOUNTER — Ambulatory Visit (INDEPENDENT_AMBULATORY_CARE_PROVIDER_SITE_OTHER): Payer: Medicare Other | Admitting: Adult Health

## 2012-12-03 ENCOUNTER — Encounter: Payer: Self-pay | Admitting: Adult Health

## 2012-12-03 VITALS — BP 98/62 | HR 84 | Temp 97.8°F | Resp 14 | Wt 106.0 lb

## 2012-12-03 DIAGNOSIS — R899 Unspecified abnormal finding in specimens from other organs, systems and tissues: Secondary | ICD-10-CM

## 2012-12-03 DIAGNOSIS — I10 Essential (primary) hypertension: Secondary | ICD-10-CM

## 2012-12-03 DIAGNOSIS — I4891 Unspecified atrial fibrillation: Secondary | ICD-10-CM

## 2012-12-03 DIAGNOSIS — Z09 Encounter for follow-up examination after completed treatment for conditions other than malignant neoplasm: Secondary | ICD-10-CM

## 2012-12-03 DIAGNOSIS — J4489 Other specified chronic obstructive pulmonary disease: Secondary | ICD-10-CM

## 2012-12-03 DIAGNOSIS — I509 Heart failure, unspecified: Secondary | ICD-10-CM

## 2012-12-03 DIAGNOSIS — R6889 Other general symptoms and signs: Secondary | ICD-10-CM

## 2012-12-03 DIAGNOSIS — J449 Chronic obstructive pulmonary disease, unspecified: Secondary | ICD-10-CM

## 2012-12-03 LAB — BASIC METABOLIC PANEL
Chloride: 90 mEq/L — ABNORMAL LOW (ref 96–112)
GFR: 35.71 mL/min — ABNORMAL LOW (ref >60.00)
Glucose, Bld: 91 mg/dL (ref 70–99)
Potassium: 4.6 mEq/L (ref 3.5–5.1)
Sodium: 127 mEq/L — ABNORMAL LOW (ref 135–145)

## 2012-12-03 MED ORDER — FLUTICASONE-SALMETEROL 100-50 MCG/DOSE IN AEPB: 1.0000 | INHALATION_SPRAY | Freq: Two times a day (BID) | RESPIRATORY_TRACT | Status: DC

## 2012-12-03 NOTE — Assessment & Plan Note (Signed)
Patient is stable. Continue Lasix daily. Continue with Advair twice a day. Instructed to rinse mouth after use. Albuterol via nebulizer every 6 hours as needed for wheezing or shortness of breath. Will check a basic metabolic panel given hyponatremia during hospitalization. Her blood pressure is on the low side today. Continue to monitor. May need to adjust dose if blood pressure remains low.

## 2012-12-03 NOTE — Assessment & Plan Note (Signed)
Dose of blood pressure medication may need further adjustment if blood pressure remains low. Continue to follow.

## 2012-12-03 NOTE — Assessment & Plan Note (Signed)
Recent hospitalization for COPD exacerbation. Completed prednisone taper. She was started on Advair. Continue nebulizer treatments with albuterol every 6 hours as needed for wheezing or shortness of breath.

## 2012-12-03 NOTE — Assessment & Plan Note (Signed)
Stable on Coreg. 

## 2012-12-03 NOTE — Progress Notes (Signed)
Subjective:    Patient ID: Wendy Sims, female    DOB: February 27, 1909, 79 y.o.   MRN: [id number]  HPI  Patient is a pleasant 79 year old female with history of hypertension, atrial fibrillation, COPD, chronic kidney disease stage III, history of congestive heart failure who presents to clinic following hospitalization. Patient was hospitalized at [facility] from 11/24/12 thru 11/27/12 for respiratory failure secondary to diastolic heart failure exacerbation, COPD exacerbation, malignant hypertension, stable chronic kidney disease, chronic atrial fibrillation with good rate control on Coreg and hyponatremia.   Patient reports feeling fatigued. She is experiencing some shortness of breath with exertion. None at rest. She presents to clinic with her daughter. Daughter reports that patient is doing well with her medications except for Advair. She is having trouble using the inhaler. She has been using the albuterol nebulizer for her shortness of breath and wheezing.   Current Outpatient Prescriptions on File Prior to Visit  Medication Sig Dispense Refill  . acetaminophen (TYLENOL) 650 MG CR tablet Take 650 mg by mouth 2 (two) times daily.      Marland Kitchen albuterol (PROVENTIL) (2.5 MG/3ML) 0.083% nebulizer solution Take 2.5 mg by nebulization 4 (four) times daily.       Marland Kitchen aspirin 81 MG tablet Take 81 mg by mouth daily.      . Calcium Polycarbophil (FIBER) 625 MG TABS Take 1 tablet by mouth daily.       . carvedilol (COREG) 6.25 MG tablet Take 1 tablet (6.25 mg total) by mouth 2 (two) times daily.  180 tablet  3  . furosemide (LASIX) 20 MG tablet Take 1 tablet (20 mg total) by mouth daily.  90 tablet  4  . lisinopril (PRINIVIL,ZESTRIL) 10 MG tablet Take 1 tablet (10 mg total) by mouth daily.  90 tablet  4  . vitamin B-12 (CYANOCOBALAMIN) 500 MCG tablet Take 500 mcg by mouth daily.      Marland Kitchen zolpidem (AMBIEN) 5 MG tablet Take 2.5 mg by mouth at bedtime as needed.       No current facility-administered medications on  file prior to visit.    Review of Systems  Constitutional: Positive for fatigue. Negative for fever and chills.       Reports eating enough  HENT: Negative.   Eyes: Negative.   Respiratory: Positive for cough, shortness of breath and wheezing. Negative for apnea, choking and chest tightness.   Cardiovascular: Negative for chest pain, palpitations and leg swelling.  Gastrointestinal: Negative.   Endocrine: Negative.   Genitourinary: Negative.   Musculoskeletal: Positive for joint swelling and arthralgias.  Skin: Negative.   Allergic/Immunologic: Negative.   Neurological: Negative.   Hematological: Negative.   Psychiatric/Behavioral: Negative.     BP 98/62  Pulse 84  Temp(Src) 97.8 F (36.6 C) (Oral)  Resp 14  Wt 106 lb (48.081 kg)  BMI 20.7 kg/m2  SpO2 96%    Objective:   Physical Exam  Constitutional: She is oriented to person, place, and time.  79 year old female, well groomed in no apparent distress  HENT:  Head: Normocephalic and atraumatic.  Cardiovascular: Normal rate, S1 normal and S2 normal.  An irregular rhythm present. Exam reveals no gallop and no S3.   No murmur heard. Pulmonary/Chest: Effort normal and breath sounds normal. No respiratory distress. She has no wheezes. She has no rales.  Abdominal: Soft. Bowel sounds are normal.  Musculoskeletal: She exhibits no tenderness.  He uses walker for ambulation. Steady gait  Neurological: She is alert and oriented to person,  place, and time.  Slightly hard of hearing.  Skin: Skin is warm and dry.  Psychiatric: She has a normal mood and affect. Her behavior is normal. Judgment and thought content normal.       Assessment & Plan:

## 2012-12-03 NOTE — Assessment & Plan Note (Signed)
Recent hospitalization for exacerbation of diastolic heart failure. Echo shows normal left ventricular systolic function with EF of 50-55%. Valvular heart disease with mild to moderate tricuspid regurg, elevated pulmonary pressure. Continue Lasix daily.

## 2012-12-05 ENCOUNTER — Observation Stay: Payer: Self-pay | Admitting: Internal Medicine

## 2012-12-05 LAB — COMPREHENSIVE METABOLIC PANEL
Albumin: 3.5 g/dL (ref 3.4–5.0)
Alkaline Phosphatase: 74 U/L (ref 50–136)
Anion Gap: 5 — ABNORMAL LOW (ref 7–16)
Calcium, Total: 9 mg/dL (ref 8.5–10.1)
Chloride: 98 mmol/L (ref 98–107)
Co2: 26 mmol/L (ref 21–32)
EGFR (African American): 44 — ABNORMAL LOW
EGFR (Non-African Amer.): 38 — ABNORMAL LOW
Glucose: 106 mg/dL — ABNORMAL HIGH (ref 65–99)
Osmolality: 277 (ref 275–301)
SGOT(AST): 25 U/L (ref 15–37)
SGPT (ALT): 18 U/L (ref 12–78)
Sodium: 129 mmol/L — ABNORMAL LOW (ref 136–145)
Total Protein: 7.1 g/dL (ref 6.4–8.2)

## 2012-12-05 LAB — URINALYSIS, COMPLETE
Glucose,UR: NEGATIVE mg/dL (ref 0–75)
Leukocyte Esterase: NEGATIVE
Nitrite: NEGATIVE
Ph: 5 (ref 4.5–8.0)
Protein: NEGATIVE
RBC,UR: 1 /HPF (ref 0–5)
Specific Gravity: 1.015 (ref 1.003–1.030)
Squamous Epithelial: NONE SEEN

## 2012-12-05 LAB — CBC
HCT: 40.4 % (ref 40.0–52.0)
HGB: 13.5 g/dL (ref 12.0–16.0)
MCH: 29.2 pg (ref 26.0–34.0)
MCHC: 33.4 g/dL (ref 32.0–36.0)
Platelet: 264 10*3/uL (ref 150–440)
RBC: 4.63 10*6/uL (ref 4.40–5.90)
RDW: 14.2 % (ref 11.5–14.5)

## 2012-12-06 LAB — BASIC METABOLIC PANEL
Anion Gap: 6 — ABNORMAL LOW (ref 7–16)
Chloride: 105 mmol/L (ref 98–107)
Co2: 24 mmol/L (ref 21–32)
EGFR (African American): 35 — ABNORMAL LOW
Glucose: 84 mg/dL (ref 65–99)

## 2012-12-06 LAB — CBC WITH DIFFERENTIAL/PLATELET
Bands: 2 %
Eosinophil: 2 %
Lymphocytes: 20 %
MCHC: 33.5 g/dL (ref 32.0–36.0)
Myelocyte: 2 %
RDW: 14.2 % (ref 11.5–14.5)
Segmented Neutrophils: 63 %
Variant Lymphocyte - H1-Rlymph: 1 %
WBC: 9.9 10*3/uL (ref 3.8–10.6)

## 2012-12-25 ENCOUNTER — Telehealth: Payer: Self-pay | Admitting: *Deleted

## 2012-12-25 NOTE — Telephone Encounter (Signed)
Spoke with Steward Drone her daughter, she stated patient is doing quite well. Does not think she need a hospital follow up, she only had that viral GI bug that was going around. They did not give her any medications just observed her overnight with fluids. But if you think she really needs to come then she will come in. She was admitted to the hospital on 5/4 and discharged on 5/5 for N&V

## 2012-12-25 NOTE — Telephone Encounter (Signed)
That is fine. If she is doing well no need for followup visit.

## 2012-12-26 NOTE — Telephone Encounter (Signed)
Left detailed information on voicemail.

## 2013-01-02 ENCOUNTER — Encounter: Payer: Self-pay | Admitting: Adult Health

## 2013-01-02 ENCOUNTER — Encounter: Payer: Self-pay | Admitting: Unknown Physician Specialty

## 2013-01-05 ENCOUNTER — Ambulatory Visit: Payer: Medicare Other | Admitting: Adult Health

## 2013-01-05 ENCOUNTER — Ambulatory Visit (INDEPENDENT_AMBULATORY_CARE_PROVIDER_SITE_OTHER): Payer: Medicare Other | Admitting: Internal Medicine

## 2013-01-05 ENCOUNTER — Encounter: Payer: Self-pay | Admitting: Internal Medicine

## 2013-01-05 ENCOUNTER — Telehealth: Payer: Self-pay | Admitting: Internal Medicine

## 2013-01-05 VITALS — BP 122/70 | HR 86 | Temp 97.8°F | Ht 60.0 in | Wt 109.0 lb

## 2013-01-05 DIAGNOSIS — S81812A Laceration without foreign body, left lower leg, initial encounter: Secondary | ICD-10-CM

## 2013-01-05 DIAGNOSIS — S81009A Unspecified open wound, unspecified knee, initial encounter: Secondary | ICD-10-CM

## 2013-01-05 NOTE — Telephone Encounter (Signed)
Patient Information:  Caller Name: Steward Drone  Phone: [phone]  Patient: Wendy, Sims  Gender: Female  DOB: May 14, 1909  Age: 79 Years  PCP: Ronna Polio (Adults only)  Office Follow Up:  Does the office need to follow up with this patient?: No  Instructions For The Office: N/A  RN Note:  Patient not present for triage.  Last saw wound 01/04/13.  Office staff already scheduled appointment for 01/05/13 at 1430 with R. Rey, NP,  Symptoms  Reason For Call & Symptoms: Emergent Call: Daughter/Brenda reported intermittent bleeding from  2" X 1/2" wound on left lower leg.  Scratched leg on walker then wound bled again when when bandaid removed.  [facility] has been dressing wound daily.  Notes small amount  (50 cent size) recurrent bleeding on dressing daily. Unable to get appointment with Wound Center until 01/14/13.  Reviewed Health History In EMR: Yes  Reviewed Medications In EMR: Yes  Reviewed Allergies In EMR: Yes  Reviewed Surgeries / Procedures: Yes  Date of Onset of Symptoms: 12/30/2012  Treatments Tried: ALF staff cleansed wound, applied dressing daily  Treatments Tried Worked: No  Guideline(s) Used:  Skin Injury  Disposition Per Guideline:   See Today in Office  Reason For Disposition Reached:   Patient wants to be seen  Advice Given:  Bleeding  : Apply direct pressure for 10 minutes with a sterile gauze to stop any bleeding.  Antibiotic Ointment  Apply an Antibiotic Ointment (e.g., OTC Bacitracin), covered by a Band-Aid or dressing. Change daily or if it becomes wet.  Option: A TEFLA dressing won't stick to the wound when it is removed.  Call Back If  You have more questions.  Patient Will Follow Care Advice:  YES  Appointment Scheduled:  01/05/2013 14:30:00 Appointment Scheduled Provider:  Orville Govern

## 2013-01-05 NOTE — Telephone Encounter (Signed)
I have some openings, could see her at 2:45

## 2013-01-05 NOTE — Telephone Encounter (Signed)
Patient does have appointment to be seen today by Raquel.

## 2013-01-05 NOTE — Telephone Encounter (Signed)
Pt's daughter, Steward Drone, notified that appointment scheduled with Dr. Dan Humphreys at 2:45.

## 2013-01-05 NOTE — Progress Notes (Signed)
  Subjective:    Patient ID: Wendy Sims, female    DOB: 24-Dec-1908, 79 y.o.   MRN: [id number]  HPI 79 year old female presents for acute visit complaining of laceration on her left lower leg. One week ago she cut her leg by rubbing against her [place] Capell. She placed a Band-Aid over the lesion and when she removed the Band-Aid several days later created a larger skin tear.  The lesion has been bleeding some and was dressed with Tefla today by nurse at her ALF. She denies fever, chills. She has mild LE edema bilaterally which is chronic and stable for her.  Outpatient Encounter Prescriptions as of 01/05/2013  Medication Sig Dispense Refill  . acetaminophen (TYLENOL) 650 MG CR tablet Take 650 mg by mouth 2 (two) times daily.      Marland Kitchen albuterol (PROVENTIL) (2.5 MG/3ML) 0.083% nebulizer solution Take 2.5 mg by nebulization 4 (four) times daily.       Marland Kitchen aspirin 81 MG tablet Take 81 mg by mouth daily.      . Calcium Polycarbophil (FIBER) 625 MG TABS Take 1 tablet by mouth daily.       . carvedilol (COREG) 6.25 MG tablet Take 1 tablet (6.25 mg total) by mouth 2 (two) times daily.  180 tablet  3  . Fluticasone-Salmeterol (ADVAIR) 100-50 MCG/DOSE AEPB Inhale 1 puff into the lungs every 12 (twelve) hours.  60 each  3  . furosemide (LASIX) 20 MG tablet Take 1 tablet (20 mg total) by mouth daily.  90 tablet  4  . lisinopril (PRINIVIL,ZESTRIL) 10 MG tablet Take 1 tablet (10 mg total) by mouth daily.  90 tablet  4  . vitamin B-12 (CYANOCOBALAMIN) 500 MCG tablet Take 500 mcg by mouth daily.      Marland Kitchen zolpidem (AMBIEN) 5 MG tablet Take 2.5 mg by mouth at bedtime as needed.       No facility-administered encounter medications on file as of 01/05/2013.   BP 122/70  Pulse 86  Temp(Src) 97.8 F (36.6 C) (Oral)  Ht 5' (1.524 m)  Wt 109 lb (49.442 kg)  BMI 21.29 kg/m2  SpO2 97%  Review of Systems  Constitutional: Negative for fever and chills.  Respiratory: Negative for shortness of breath.   Cardiovascular:  Positive for leg swelling. Negative for chest pain.  Skin: Positive for color change and wound.       Objective:   Physical Exam  Constitutional: She is oriented to person, place, and time. She appears well-developed and well-nourished. No distress.  HENT:  Head: Normocephalic and atraumatic.  Eyes: Conjunctivae and EOM are normal. Pupils are equal, round, and reactive to light.  Neck: Normal range of motion.  Pulmonary/Chest: Effort normal.  Musculoskeletal: Normal range of motion. She exhibits edema (trace, BLE to knee).  Neurological: She is alert and oriented to person, place, and time. She has normal reflexes.  Skin: Skin is warm and dry. Laceration noted. She is not diaphoretic.             Assessment & Plan:

## 2013-01-05 NOTE — Assessment & Plan Note (Signed)
Laceration consistent with uncomplicated skin tear. Lesion appears to be healing well. Will continue to cover with Telfa. Will set up evaluation at wound healing center. Follow up 2 days for recheck.

## 2013-01-07 ENCOUNTER — Ambulatory Visit (INDEPENDENT_AMBULATORY_CARE_PROVIDER_SITE_OTHER): Payer: Medicare Other | Admitting: Internal Medicine

## 2013-01-07 ENCOUNTER — Encounter: Payer: Self-pay | Admitting: Internal Medicine

## 2013-01-07 VITALS — BP 128/68 | HR 88 | Temp 98.4°F | Ht 60.0 in | Wt 108.0 lb

## 2013-01-07 DIAGNOSIS — S91009A Unspecified open wound, unspecified ankle, initial encounter: Secondary | ICD-10-CM

## 2013-01-07 DIAGNOSIS — S81812A Laceration without foreign body, left lower leg, initial encounter: Secondary | ICD-10-CM

## 2013-01-07 NOTE — Progress Notes (Signed)
  Subjective:    Patient ID: Wendy Sims, female    DOB: 1909-06-21, 79 y.o.   MRN: [id number]  HPI  79 year old female presents for follow up of laceration on her left lower leg. Wound dressing was changed by nursing facility and dry gauze was placed. Patient reports some discomfort in removing this dressing. She denies any fever, chills. She has mild pain at the site of laceration.  Outpatient Encounter Prescriptions as of 01/07/2013  Medication Sig Dispense Refill  . acetaminophen (TYLENOL) 650 MG CR tablet Take 650 mg by mouth 2 (two) times daily.      Marland Kitchen albuterol (PROVENTIL) (2.5 MG/3ML) 0.083% nebulizer solution Take 2.5 mg by nebulization 4 (four) times daily.       Marland Kitchen aspirin 81 MG tablet Take 81 mg by mouth daily.      . Calcium Polycarbophil (FIBER) 625 MG TABS Take 1 tablet by mouth daily.       . carvedilol (COREG) 6.25 MG tablet Take 1 tablet (6.25 mg total) by mouth 2 (two) times daily.  180 tablet  3  . Fluticasone-Salmeterol (ADVAIR) 100-50 MCG/DOSE AEPB Inhale 1 puff into the lungs every 12 (twelve) hours.  60 each  3  . furosemide (LASIX) 20 MG tablet Take 1 tablet (20 mg total) by mouth daily.  90 tablet  4  . lisinopril (PRINIVIL,ZESTRIL) 10 MG tablet Take 1 tablet (10 mg total) by mouth daily.  90 tablet  4  . vitamin B-12 (CYANOCOBALAMIN) 500 MCG tablet Take 500 mcg by mouth daily.      Marland Kitchen zolpidem (AMBIEN) 5 MG tablet Take 2.5 mg by mouth at bedtime as needed.       No facility-administered encounter medications on file as of 01/07/2013.   BP 128/68  Pulse 88  Temp(Src) 98.4 F (36.9 C) (Oral)  Ht 5' (1.524 m)  Wt 108 lb (48.988 kg)  BMI 21.09 kg/m2  SpO2 98%  Review of Systems  Constitutional: Negative for fever and chills.  Respiratory: Negative for shortness of breath.   Cardiovascular: Positive for leg swelling. Negative for chest pain.  Skin: Positive for color change and wound.       Objective:   Physical Exam  Constitutional: She is oriented to  person, place, and time. She appears well-developed and well-nourished. No distress.  HENT:  Head: Normocephalic and atraumatic.  Eyes: Conjunctivae and EOM are normal. Pupils are equal, round, and reactive to light.  Neck: Normal range of motion.  Pulmonary/Chest: Effort normal.  Musculoskeletal: Normal range of motion. She exhibits edema (trace, BLE to knee).  Neurological: She is alert and oriented to person, place, and time. She has normal reflexes.  Skin: Skin is warm and dry. Laceration noted. She is not diaphoretic.             Assessment & Plan:

## 2013-01-07 NOTE — Assessment & Plan Note (Signed)
Appears to be healing well with healthy granulation tissue present. Recommended not using dry gauze for this wound. Will continue Tefla. Follow up for recheck in 2 days.

## 2013-01-09 ENCOUNTER — Ambulatory Visit (INDEPENDENT_AMBULATORY_CARE_PROVIDER_SITE_OTHER): Payer: Medicare Other | Admitting: Internal Medicine

## 2013-01-09 ENCOUNTER — Encounter: Payer: Self-pay | Admitting: Internal Medicine

## 2013-01-09 VITALS — BP 140/98 | HR 76 | Temp 98.4°F | Wt 109.0 lb

## 2013-01-09 DIAGNOSIS — S81812A Laceration without foreign body, left lower leg, initial encounter: Secondary | ICD-10-CM

## 2013-01-09 DIAGNOSIS — S91009A Unspecified open wound, unspecified ankle, initial encounter: Secondary | ICD-10-CM

## 2013-01-09 DIAGNOSIS — S81009A Unspecified open wound, unspecified knee, initial encounter: Secondary | ICD-10-CM

## 2013-01-09 DIAGNOSIS — Z23 Encounter for immunization: Secondary | ICD-10-CM

## 2013-01-09 NOTE — Assessment & Plan Note (Signed)
Wound appears to be healing well with clean base. Will continue to apply Telfa. Will set up home health for dressing changes. Followup one week or sooner as needed.

## 2013-01-09 NOTE — Addendum Note (Signed)
Addended by: Theola Sequin on: 01/09/2013 05:01 PM   Modules accepted: Orders

## 2013-01-09 NOTE — Progress Notes (Signed)
Subjective:    Patient ID: Wendy Sims, female    DOB: 1908/10/03, 79 y.o.   MRN: [id number]  HPI 79 year old female presents for followup of left lower extremity wound. She continues to have Telfa over the wound site. There has been drainage of some serosanguineous fluid. She denies any fever or chills. She denies any worsening pain at the wound site.  Outpatient Encounter Prescriptions as of 01/09/2013  Medication Sig Dispense Refill  . acetaminophen (TYLENOL) 650 MG CR tablet Take 650 mg by mouth 2 (two) times daily.      Marland Kitchen albuterol (PROVENTIL) (2.5 MG/3ML) 0.083% nebulizer solution Take 2.5 mg by nebulization 4 (four) times daily.       Marland Kitchen aspirin 81 MG tablet Take 81 mg by mouth daily.      . Calcium Polycarbophil (FIBER) 625 MG TABS Take 1 tablet by mouth daily.       . carvedilol (COREG) 6.25 MG tablet Take 1 tablet (6.25 mg total) by mouth 2 (two) times daily.  180 tablet  3  . Fluticasone-Salmeterol (ADVAIR) 100-50 MCG/DOSE AEPB Inhale 1 puff into the lungs every 12 (twelve) hours.  60 each  3  . furosemide (LASIX) 20 MG tablet Take 1 tablet (20 mg total) by mouth daily.  90 tablet  4  . lisinopril (PRINIVIL,ZESTRIL) 10 MG tablet Take 1 tablet (10 mg total) by mouth daily.  90 tablet  4  . vitamin B-12 (CYANOCOBALAMIN) 500 MCG tablet Take 500 mcg by mouth daily.      Marland Kitchen zolpidem (AMBIEN) 5 MG tablet Take 2.5 mg by mouth at bedtime as needed.       No facility-administered encounter medications on file as of 01/09/2013.   BP 140/98  Pulse 76  Temp(Src) 98.4 F (36.9 C) (Oral)  Wt 109 lb (49.442 kg)  BMI 21.29 kg/m2  Review of Systems  Constitutional: Negative for fever, chills, appetite change, fatigue and unexpected weight change.  HENT: Negative for neck pain.   Eyes: Negative for visual disturbance.  Respiratory: Negative for cough, shortness of breath, wheezing and stridor.   Cardiovascular: Positive for leg swelling. Negative for chest pain and palpitations.   Gastrointestinal: Negative for abdominal pain.  Genitourinary: Negative for dysuria and flank pain.  Musculoskeletal: Negative for myalgias, arthralgias and gait problem.  Skin: Positive for wound. Negative for color change and rash.  Neurological: Negative for dizziness and headaches.  Hematological: Negative for adenopathy. Does not bruise/bleed easily.  Psychiatric/Behavioral: Negative for suicidal ideas, sleep disturbance and dysphoric mood. The patient is not nervous/anxious.        Objective:   Physical Exam  Constitutional: She is oriented to person, place, and time. She appears well-developed and well-nourished. No distress.  HENT:  Head: Normocephalic and atraumatic.  Right Ear: External ear normal.  Left Ear: External ear normal.  Nose: Nose normal.  Mouth/Throat: Oropharynx is clear and moist. No oropharyngeal exudate.  Eyes: Conjunctivae are normal. Pupils are equal, round, and reactive to light. Right eye exhibits no discharge. Left eye exhibits no discharge. No scleral icterus.  Neck: Normal range of motion. Neck supple. No tracheal deviation present. No thyromegaly present.  Pulmonary/Chest: Effort normal. No accessory muscle usage. Not tachypneic. She has no decreased breath sounds. She has no rhonchi.  Musculoskeletal: Normal range of motion. She exhibits no edema and no tenderness.  Lymphadenopathy:    She has no cervical adenopathy.  Neurological: She is alert and oriented to person, place, and time. No cranial nerve deficit. She  exhibits normal muscle tone. Coordination normal.  Skin: Skin is warm and dry. Laceration (10cmx2.5cm open skin tear with clean base left lower leg) noted. No rash noted. She is not diaphoretic. No erythema. No pallor.  Psychiatric: She has a normal mood and affect. Her behavior is normal. Judgment and thought content normal.          Assessment & Plan:

## 2013-01-14 ENCOUNTER — Encounter: Payer: Self-pay | Admitting: Nurse Practitioner

## 2013-01-14 ENCOUNTER — Encounter: Payer: Self-pay | Admitting: Cardiothoracic Surgery

## 2013-01-15 ENCOUNTER — Ambulatory Visit: Payer: Medicare Other | Admitting: Internal Medicine

## 2013-01-29 ENCOUNTER — Other Ambulatory Visit: Payer: Self-pay | Admitting: *Deleted

## 2013-01-29 MED ORDER — FLUTICASONE-SALMETEROL 100-50 MCG/DOSE IN AEPB: 1.0000 | INHALATION_SPRAY | Freq: Two times a day (BID) | RESPIRATORY_TRACT | Status: DC

## 2013-01-30 ENCOUNTER — Encounter: Payer: Self-pay | Admitting: Internal Medicine

## 2013-02-03 ENCOUNTER — Encounter: Payer: Self-pay | Admitting: Nurse Practitioner

## 2013-02-12 ENCOUNTER — Encounter: Payer: Self-pay | Admitting: Cardiothoracic Surgery

## 2013-02-23 ENCOUNTER — Inpatient Hospital Stay: Payer: Self-pay | Admitting: Internal Medicine

## 2013-02-23 LAB — BASIC METABOLIC PANEL
Anion Gap: 5 — ABNORMAL LOW (ref 7–16)
Calcium, Total: 9.4 mg/dL (ref 8.5–10.1)
Calcium, Total: 9.4 mg/dL (ref 8.5–10.1)
Chloride: 98 mmol/L (ref 98–107)
Chloride: 97 mmol/L — ABNORMAL LOW (ref 98–107)
Co2: 25 mmol/L (ref 21–32)
Creatinine: 1.51 mg/dL — ABNORMAL HIGH (ref 0.60–1.30)
Glucose: 92 mg/dL (ref 65–99)
Potassium: 5 mmol/L (ref 3.5–5.1)

## 2013-02-23 LAB — URINALYSIS, COMPLETE
Hyaline Cast: 10
Ketone: NEGATIVE
Leukocyte Esterase: NEGATIVE
Ph: 5 (ref 4.5–8.0)
Protein: NEGATIVE
RBC,UR: 4 /HPF (ref 0–5)

## 2013-02-23 LAB — CBC
HGB: 9.7 g/dL — ABNORMAL LOW (ref 12.0–16.0)
MCV: 87 fL (ref 80–100)
Platelet: 168 10*3/uL (ref 150–440)
RBC: 3.36 10*6/uL — ABNORMAL LOW (ref 4.40–5.90)
RDW: 13.5 % (ref 11.5–14.5)
WBC: 17.7 10*3/uL — ABNORMAL HIGH (ref 3.8–10.6)

## 2013-02-23 LAB — CK TOTAL AND CKMB (NOT AT ARMC)
CK, Total: 33 U/L (ref 21–232)
CK, Total: 51 U/L (ref 21–232)
CK-MB: <0.5 ng/mL — ABNORMAL LOW (ref 0.5–3.6)
CK-MB: 0.8 ng/mL (ref 0.5–3.6)

## 2013-02-23 LAB — PRO B NATRIURETIC PEPTIDE: B-Type Natriuretic Peptide: 7204 pg/mL — ABNORMAL HIGH (ref 0–125)

## 2013-02-23 LAB — TROPONIN I: Troponin-I: <0.02 ng/mL

## 2013-02-24 LAB — CK TOTAL AND CKMB (NOT AT ARMC)
CK, Total: 42 U/L (ref 21–232)
CK-MB: 0.8 ng/mL (ref 0.5–3.6)

## 2013-02-24 LAB — BASIC METABOLIC PANEL
Anion Gap: 5 — ABNORMAL LOW (ref 7–16)
BUN: 50 mg/dL — ABNORMAL HIGH (ref 7–18)
Co2: 25 mmol/L (ref 21–32)
Osmolality: 267 (ref 275–301)
Sodium: 126 mmol/L — ABNORMAL LOW (ref 136–145)

## 2013-02-25 ENCOUNTER — Telehealth: Payer: Self-pay | Admitting: Internal Medicine

## 2013-02-25 LAB — BASIC METABOLIC PANEL
Anion Gap: 6 — ABNORMAL LOW (ref 7–16)
BUN: 67 mg/dL — ABNORMAL HIGH (ref 7–18)
Co2: 24 mmol/L (ref 21–32)
Creatinine: 2.21 mg/dL — ABNORMAL HIGH (ref 0.60–1.30)
EGFR (African American): 20 — ABNORMAL LOW
Osmolality: 268 (ref 275–301)
Potassium: 4.7 mmol/L (ref 3.5–5.1)
Sodium: 124 mmol/L — ABNORMAL LOW (ref 136–145)

## 2013-02-25 NOTE — Telephone Encounter (Signed)
Call from [facility] advising that pt has been in hospital since Monday due to her breathing problems.  Will be at hospital another day or two and then will be sent to rehab to get her strength back.  Was told has some congestive heart failure and told by several doctors that she should be set up with a heart doctor.

## 2013-02-25 NOTE — Telephone Encounter (Signed)
FYI to Dr. Walker 

## 2013-02-25 NOTE — Telephone Encounter (Signed)
Sounds good. She will need a follow up visit. We can set her up with Dr. Mariah Milling in cardiology.

## 2013-02-26 LAB — CBC WITH DIFFERENTIAL/PLATELET
Basophil #: 0.1 10*3/uL (ref 0.0–0.1)
Basophil %: 0.5 %
Eosinophil #: 0.4 10*3/uL (ref 0.0–0.7)
HCT: 24.1 % — ABNORMAL LOW (ref 40.0–52.0)
Lymphocyte #: 2 10*3/uL (ref 1.0–3.6)
Lymphocyte %: 16.5 %
MCH: 29.2 pg (ref 26.0–34.0)
MCHC: 34.1 g/dL (ref 32.0–36.0)
Monocyte #: 1 10*3/uL (ref 0.2–1.0)
Neutrophil #: 8.4 10*3/uL — ABNORMAL HIGH (ref 1.4–6.5)
Neutrophil %: 71.2 %
Platelet: 186 10*3/uL (ref 150–440)
RBC: 2.82 10*6/uL — ABNORMAL LOW (ref 4.40–5.90)
WBC: 11.8 10*3/uL — ABNORMAL HIGH (ref 3.8–10.6)

## 2013-02-26 LAB — BASIC METABOLIC PANEL
Anion Gap: 5 — ABNORMAL LOW (ref 7–16)
Calcium, Total: 8.5 mg/dL (ref 8.5–10.1)
Chloride: 95 mmol/L — ABNORMAL LOW (ref 98–107)
Osmolality: 268 (ref 275–301)
Potassium: 4.7 mmol/L (ref 3.5–5.1)
Sodium: 125 mmol/L — ABNORMAL LOW (ref 136–145)

## 2013-02-26 LAB — MAGNESIUM: Magnesium: 2 mg/dL

## 2013-02-26 NOTE — Telephone Encounter (Signed)
We can place a Home Health referral through [facility] (assuming she does not have Home Health in place already) and they can get her a hospital bed

## 2013-02-26 NOTE — Telephone Encounter (Signed)
Ms. Loleta Chance asking if Ms. Kovacevic can get a hospital bed at [facility] that will "roll up" so the patient can sit up in bed to help her breathing.

## 2013-02-26 NOTE — Telephone Encounter (Signed)
LDVM on home phone with all apt information for Steward Drone for Ms. Gaylyn Cheers.

## 2013-02-26 NOTE — Telephone Encounter (Signed)
Patient has an apt with Dr. Mariah Milling on 8/15 @ 11:15

## 2013-02-26 NOTE — Telephone Encounter (Signed)
Patient left voicemail requesting a hospital bed

## 2013-02-26 NOTE — Telephone Encounter (Signed)
Advised Ms. Hill that Dr. Dan Humphreys has recommended Dr. Mariah Milling.  Advised Ms. Hill that [facility] will be in touch with an appt for Dr. Mariah Milling.   Pt had 3 mth f/u with Dr. Dan Humphreys scheduled for 8/6, now will be visit for hospital f/u.

## 2013-02-27 LAB — CBC WITH DIFFERENTIAL/PLATELET
Basophil #: 0.1 10*3/uL (ref 0.0–0.1)
Eosinophil #: 0.3 10*3/uL (ref 0.0–0.7)
Eosinophil %: 2.9 %
HCT: 26.2 % — ABNORMAL LOW (ref 40.0–52.0)
HGB: 8.7 g/dL — ABNORMAL LOW (ref 12.0–16.0)
Lymphocyte %: 17.3 %
MCH: 28.9 pg (ref 26.0–34.0)
MCV: 87 fL (ref 80–100)
Monocyte #: 1 10*3/uL (ref 0.2–1.0)
Neutrophil #: 7.8 10*3/uL — ABNORMAL HIGH (ref 1.4–6.5)
RBC: 3.02 10*6/uL — ABNORMAL LOW (ref 4.40–5.90)
RDW: 13.2 % (ref 11.5–14.5)
WBC: 10.9 10*3/uL — ABNORMAL HIGH (ref 3.8–10.6)

## 2013-02-27 LAB — BASIC METABOLIC PANEL
Anion Gap: 7 (ref 7–16)
BUN: 53 mg/dL — ABNORMAL HIGH (ref 7–18)
Chloride: 97 mmol/L — ABNORMAL LOW (ref 98–107)
Co2: 24 mmol/L (ref 21–32)
Creatinine: 1.28 mg/dL (ref 0.60–1.30)
EGFR (Non-African Amer.): 34 — ABNORMAL LOW
Glucose: 89 mg/dL (ref 65–99)
Osmolality: 271 (ref 275–301)
Sodium: 128 mmol/L — ABNORMAL LOW (ref 136–145)

## 2013-02-27 NOTE — Telephone Encounter (Signed)
Steward Drone called back and left a message she would like Rx to go to [place] at [employer] [facility].

## 2013-02-27 NOTE — Telephone Encounter (Signed)
Patient is being released from the hospital and going back to [facility]. Daughter would like a hospital bed that would help her with her breathing, she called [facility] and they told her she needed to get a prescription from her doctor.

## 2013-02-28 LAB — CULTURE, BLOOD (SINGLE)

## 2013-02-28 LAB — BASIC METABOLIC PANEL
Anion Gap: 8 (ref 7–16)
BUN: 47 mg/dL — ABNORMAL HIGH (ref 7–18)
Calcium, Total: 9.3 mg/dL (ref 8.5–10.1)
EGFR (Non-African Amer.): 38 — ABNORMAL LOW
Glucose: 91 mg/dL (ref 65–99)
Osmolality: 276 (ref 275–301)
Sodium: 132 mmol/L — ABNORMAL LOW (ref 136–145)

## 2013-03-02 ENCOUNTER — Telehealth: Payer: Self-pay | Admitting: *Deleted

## 2013-03-02 ENCOUNTER — Telehealth: Payer: Self-pay | Admitting: Internal Medicine

## 2013-03-02 NOTE — Telephone Encounter (Signed)
Patient has returned to them, the hospital put on her FL2 for Oxygen. Patient will not use the Oxygen at all her all and her daughter stated they discontinued the Oxygen in the hospital so she is confused why they put it on her FL2. Ms. Wendy Sims will not use the Oxygen so they would like to know if you would d/c it.

## 2013-03-02 NOTE — Telephone Encounter (Signed)
Patient needing a follow up appointment this week per the hospital doc.

## 2013-03-02 NOTE — Telephone Encounter (Signed)
Noted  

## 2013-03-02 NOTE — Telephone Encounter (Signed)
Informed Samantha, need orders faxed to them.

## 2013-03-02 NOTE — Telephone Encounter (Signed)
Prescription for hospital bed faxed to [facility]

## 2013-03-02 NOTE — Telephone Encounter (Signed)
Spoke with pt's daughter got her in on Friday at 1:15 pm ok per Dr. Dan Humphreys

## 2013-03-02 NOTE — Telephone Encounter (Signed)
Sure. Fine to D/C oxygen

## 2013-03-06 ENCOUNTER — Encounter: Payer: Self-pay | Admitting: Internal Medicine

## 2013-03-06 ENCOUNTER — Ambulatory Visit (INDEPENDENT_AMBULATORY_CARE_PROVIDER_SITE_OTHER): Payer: Medicare Other | Admitting: Internal Medicine

## 2013-03-06 VITALS — BP 128/72 | HR 92 | Temp 98.1°F | Resp 26 | Wt 112.0 lb

## 2013-03-06 DIAGNOSIS — I509 Heart failure, unspecified: Secondary | ICD-10-CM

## 2013-03-06 DIAGNOSIS — L570 Actinic keratosis: Secondary | ICD-10-CM | POA: Insufficient documentation

## 2013-03-06 LAB — COMPREHENSIVE METABOLIC PANEL
ALT: 12 U/L (ref 0–35)
CO2: 27 mEq/L (ref 19–32)
Calcium: 9.7 mg/dL (ref 8.4–10.5)
Chloride: 96 mEq/L (ref 96–112)
Creatinine, Ser: 1.2 mg/dL (ref 0.4–1.2)
GFR: 42.47 mL/min — ABNORMAL LOW (ref >60.00)
Glucose, Bld: 90 mg/dL (ref 70–99)
Sodium: 131 mEq/L — ABNORMAL LOW (ref 135–145)
Total Protein: 7.2 g/dL (ref 6.0–8.3)

## 2013-03-06 LAB — CBC WITH DIFFERENTIAL/PLATELET
Basophils Absolute: 0 10*3/uL (ref 0.0–0.1)
Lymphocytes Relative: 14.5 % (ref 12.0–46.0)
Monocytes Relative: 7.6 % (ref 3.0–12.0)
Platelets: 334 10*3/uL (ref 150.0–400.0)
RDW: 14 % (ref 11.5–14.6)
WBC: 13.4 10*3/uL — ABNORMAL HIGH (ref 4.5–10.5)

## 2013-03-06 MED ORDER — ZOLPIDEM TARTRATE 5 MG PO TABS: 2.5000 mg | ORAL_TABLET | Freq: Every evening | ORAL | Status: DC | PRN

## 2013-03-06 NOTE — Assessment & Plan Note (Signed)
Will check renal function with labs today. Discussed with patient and her daughter that we will need to monitor kidney function closely while she is on furosemide.

## 2013-03-06 NOTE — Assessment & Plan Note (Signed)
Lesion on mid scalp consistent with AK. Will set up dermatology evaluation for cryotherapy or biopsy.

## 2013-03-06 NOTE — Progress Notes (Signed)
Subjective:    Patient ID: Wendy Sims, female    DOB: Dec 18, 1908, 79 y.o.   MRN: [id number]  HPI 79YO female with h/o CHF, COPD, CKD presents for follow up after recent hospitalization. Admitted Monday July 14th for shortness of breath and LE edema.  During hospitalization, diuresed with IV lasix, however had decline in renal function so diuresis was limited. Remained inpatient through Saturday July 19th when she was discharged home to skilled nursing.  Doing well since discharge. Some chronic dyspnea with exertion. Will not participate with PT. Appetite poor per staff at facility, however pt disagrees with this. No chest pain, dyspnea at rest. Bilateral LE edema, esp around her ankles noted over last few days. Taking Furosemide 20mg  daily.  Outpatient Encounter Prescriptions as of 03/06/2013  Medication Sig Dispense Refill  . acetaminophen (TYLENOL) 500 MG tablet Take 500 mg by mouth. Every morning as needed      . albuterol (PROVENTIL) (2.5 MG/3ML) 0.083% nebulizer solution Take 2.5 mg by nebulization 4 (four) times daily.       Marland Kitchen aspirin 81 MG tablet Take 81 mg by mouth daily.      . Calcium Polycarbophil (FIBER) 625 MG TABS Take 1 tablet by mouth daily.       . carvedilol (COREG) 3.125 MG tablet Take 3.125 mg by mouth 2 (two) times daily with a meal.      . Fluticasone-Salmeterol (ADVAIR) 100-50 MCG/DOSE AEPB Inhale 1 puff into the lungs every 12 (twelve) hours.  60 each  3  . furosemide (LASIX) 20 MG tablet Take 1 tablet (20 mg total) by mouth daily.  90 tablet  4  . loratadine (CLARITIN) 10 MG tablet Take 10 mg by mouth daily.      . vitamin B-12 (CYANOCOBALAMIN) 500 MCG tablet Take 1,000 mcg by mouth daily.       Marland Kitchen zolpidem (AMBIEN) 5 MG tablet Take 0.5 tablets (2.5 mg total) by mouth at bedtime as needed.  90 tablet  0   No facility-administered encounter medications on file as of 03/06/2013.     Review of Systems  Constitutional: Positive for fatigue. Negative for fever, chills,  appetite change and unexpected weight change.  HENT: Negative for ear pain, congestion, sore throat, trouble swallowing, neck pain, voice change and sinus pressure.   Eyes: Negative for visual disturbance.  Respiratory: Positive for shortness of breath. Negative for cough, wheezing and stridor.   Cardiovascular: Positive for leg swelling. Negative for chest pain and palpitations.  Gastrointestinal: Negative for nausea, vomiting, abdominal pain, diarrhea, constipation, blood in stool, abdominal distention and anal bleeding.  Genitourinary: Negative for dysuria and flank pain.  Musculoskeletal: Negative for myalgias, arthralgias and gait problem.  Skin: Negative for color change and rash.  Neurological: Negative for dizziness and headaches.  Hematological: Negative for adenopathy. Does not bruise/bleed easily.  Psychiatric/Behavioral: Negative for suicidal ideas, sleep disturbance and dysphoric mood. The patient is not nervous/anxious.        Objective:   Physical Exam  Constitutional: She is oriented to person, place, and time. She appears well-developed and well-nourished. No distress.  HENT:  Head: Normocephalic and atraumatic.  Right Ear: External ear normal.  Left Ear: External ear normal.  Nose: Nose normal.  Mouth/Throat: Oropharynx is clear and moist. No oropharyngeal exudate.  Eyes: Conjunctivae are normal. Pupils are equal, round, and reactive to light. Right eye exhibits no discharge. Left eye exhibits no discharge. No scleral icterus.  Neck: Normal range of motion. Neck supple. No tracheal  deviation present. No thyromegaly present.  Cardiovascular: Normal rate, regular rhythm, normal heart sounds and intact distal pulses.  Exam reveals no gallop and no friction rub.   No murmur heard. Pulmonary/Chest: Effort normal. No accessory muscle usage. Not tachypneic. No respiratory distress. She has no decreased breath sounds. She has no wheezes. She has no rhonchi. She has rales (few  scattered). She exhibits no tenderness.  Musculoskeletal: Normal range of motion. She exhibits edema (pitting to lower shin bilaterally). She exhibits no tenderness.  Lymphadenopathy:    She has no cervical adenopathy.  Neurological: She is alert and oriented to person, place, and time. No cranial nerve deficit. She exhibits normal muscle tone. Coordination normal.  Skin: Skin is warm and dry. Lesion (approx 1cm diameter rough pink papule mid scalp) noted. No rash noted. She is not diaphoretic. No erythema. No pallor.  Psychiatric: She has a normal mood and affect. Her behavior is normal. Judgment and thought content normal.          Assessment & Plan:

## 2013-03-06 NOTE — Assessment & Plan Note (Signed)
S/p recent admission for CHF exacerbation. Symptomatically improved. Mild dyspnea with exertion and LEE around ankles. Weight is near baseline at 112lbs. Will check renal function and BNP with labs today. Continue Furosemide 20mg  daily. Continue Carvedilol. Will continue to hold lisinopril until renal function back. Pt has follow up with cardiology in 2 weeks, then will follow up here in 4 weeks.

## 2013-03-09 LAB — BRAIN NATRIURETIC PEPTIDE: Pro B Natriuretic peptide (BNP): 372 pg/mL — ABNORMAL HIGH (ref 0.0–100.0)

## 2013-03-10 ENCOUNTER — Telehealth: Payer: Self-pay | Admitting: *Deleted

## 2013-03-10 NOTE — Telephone Encounter (Signed)
Spoke with Eber Jones and faxed to [phone]

## 2013-03-10 NOTE — Telephone Encounter (Signed)
Please have her take a second dose of Lasix this afternoon and two doses tomorrow as well. She should let us know if symptoms not improving.

## 2013-03-10 NOTE — Telephone Encounter (Signed)
Patient daughter left a message on voicemail stating patient's feet are very swollen. Previous instructions stated patient should take extra pill if 3 pound increase. Daughter stated it was so bad, her finger made an indention so deep she could barely see the tip of her finger. Would like to know what to do, then let them know at homeplace.

## 2013-03-11 ENCOUNTER — Ambulatory Visit: Payer: Medicare Other | Admitting: Internal Medicine

## 2013-03-20 ENCOUNTER — Encounter: Payer: Self-pay | Admitting: Cardiovascular Disease

## 2013-03-20 ENCOUNTER — Telehealth: Payer: Self-pay | Admitting: *Deleted

## 2013-03-20 ENCOUNTER — Ambulatory Visit (INDEPENDENT_AMBULATORY_CARE_PROVIDER_SITE_OTHER): Payer: Medicare Other | Admitting: Cardiovascular Disease

## 2013-03-20 VITALS — BP 102/62 | HR 85 | Ht 60.0 in | Wt 110.5 lb

## 2013-03-20 DIAGNOSIS — I2789 Other specified pulmonary heart diseases: Secondary | ICD-10-CM

## 2013-03-20 DIAGNOSIS — R269 Unspecified abnormalities of gait and mobility: Secondary | ICD-10-CM

## 2013-03-20 DIAGNOSIS — R2681 Unsteadiness on feet: Secondary | ICD-10-CM

## 2013-03-20 DIAGNOSIS — R609 Edema, unspecified: Secondary | ICD-10-CM

## 2013-03-20 DIAGNOSIS — I4891 Unspecified atrial fibrillation: Secondary | ICD-10-CM

## 2013-03-20 DIAGNOSIS — I509 Heart failure, unspecified: Secondary | ICD-10-CM

## 2013-03-20 DIAGNOSIS — I272 Pulmonary hypertension, unspecified: Secondary | ICD-10-CM

## 2013-03-20 MED ORDER — FUROSEMIDE 20 MG PO TABS: 20.0000 mg | ORAL_TABLET | Freq: Two times a day (BID) | ORAL | Status: DC

## 2013-03-20 MED ORDER — FERROUS SULFATE 325 (65 FE) MG PO TABS: 325.0000 mg | ORAL_TABLET | Freq: Every day | ORAL | Status: DC

## 2013-03-20 MED ORDER — CARVEDILOL 3.125 MG PO TABS: 3.1250 mg | ORAL_TABLET | Freq: Two times a day (BID) | ORAL | Status: DC

## 2013-03-20 NOTE — Progress Notes (Signed)
Patient ID: Wendy Sims, female    DOB: 16-Feb-1909, 78 y.o.   MRN: [id number]  HPI Comments: 79 YO female with h/o  Diastolic CHF,  severe pulmonary hypertension , chronic atrial fibrillation, chronic hyponatremia, anemia, CKD (worse with diuresis) presents for follow up after recent hospitalization.  Several recent hospital admissions for worsening shortness of breath.  Admitted  July 14th for shortness of breath and LE edema.  During hospitalization, diuresed with IV lasix, however had worsening renal function . discharged home to skilled nursing.    Carvedilol was decreased down to 3.125 mg twice a day presumably for low blood pressure   Today she reports having Some chronic dyspnea with exertion. Not very active at baseline. No chest pain, dyspnea at rest. Taking Furosemide 20mg  daily. This morning she had slight increase in her shortness of breath and she did albuterol nebulizer treatment.  She does have an order for extra Lasix at 2 PM for weight gain. Unclear if this has been given or not. Walks with a walker.  Echocardiogram dated 11/25/2012 shows ejection fraction 50-55%, mild to moderate TR, right ventricular systolic pressure estimated at 75 mmHg  EKG today shows atrial fibrillation with ventricular rate 85 beats per minute     Outpatient Encounter Prescriptions as of 03/20/2013  Medication Sig Dispense Refill  . acetaminophen (TYLENOL) 500 MG tablet Take 500 mg by mouth. Every morning as needed      . albuterol (PROVENTIL) (2.5 MG/3ML) 0.083% nebulizer solution Take 2.5 mg by nebulization 4 (four) times daily.       Marland Kitchen aspirin 81 MG tablet Take 81 mg by mouth daily.      . Calcium Polycarbophil (FIBER) 625 MG TABS Take 1 tablet by mouth daily.       . carvedilol (COREG) 3.125 MG tablet Take 1 tablet (3.125 mg total) by mouth 2 (two) times daily with a meal.  180 tablet  3  . Fluticasone-Salmeterol (ADVAIR) 100-50 MCG/DOSE AEPB Inhale 1 puff into the lungs every 12 (twelve)  hours.  60 each  3  . furosemide (LASIX) 20 MG tablet Take 1 tablet (20 mg total) by mouth 2 (two) times daily.  180 tablet  4  . loratadine (CLARITIN) 10 MG tablet Take 10 mg by mouth daily.      . vitamin B-12 (CYANOCOBALAMIN) 500 MCG tablet Take 1,000 mcg by mouth daily.       Marland Kitchen zolpidem (AMBIEN) 5 MG tablet Take 0.5 tablets (2.5 mg total) by mouth at bedtime as needed.  90 tablet  0    Review of Systems  Constitutional: Negative.   HENT: Negative.   Eyes: Negative.   Respiratory: Positive for shortness of breath.   Cardiovascular: Negative.   Gastrointestinal: Negative.   Musculoskeletal: Positive for gait problem.  Skin: Negative.   Neurological: Negative.   Psychiatric/Behavioral: Negative.   All other systems reviewed and are negative.    BP 102/62  Pulse 85  Ht 5' (1.524 m)  Wt 110 lb 8 oz (50.122 kg)  BMI 21.58 kg/m2  Physical Exam  Nursing note and vitals reviewed. Constitutional: She is oriented to person, place, and time. She appears well-developed and well-nourished.  HENT:  Head: Normocephalic.  Nose: Nose normal.  Mouth/Throat: Oropharynx is clear and moist.  Eyes: Conjunctivae are normal. Pupils are equal, round, and reactive to light.  Neck: Normal range of motion. Neck supple. No JVD present.  Cardiovascular: Normal rate, regular rhythm, S1 normal, S2 normal, normal heart sounds and  intact distal pulses.  Exam reveals no gallop and no friction rub.   No murmur heard. Pulmonary/Chest: Effort normal and breath sounds normal. No respiratory distress. She has no wheezes. She has no rales. She exhibits no tenderness.  Abdominal: Soft. Bowel sounds are normal. She exhibits no distension. There is no tenderness.  Musculoskeletal: Normal range of motion. She exhibits no edema and no tenderness.  Lymphadenopathy:    She has no cervical adenopathy.  Neurological: She is alert and oriented to person, place, and time. Coordination normal.  Skin: Skin is warm and  dry. No rash noted. No erythema.  Psychiatric: She has a normal mood and affect. Her behavior is normal. Judgment and thought content normal.    Assessment and Plan

## 2013-03-20 NOTE — Assessment & Plan Note (Signed)
Chronic atrial fibrillation. Rate relatively well controlled on low-dose carvedilol. Not a good candidate for anticoagulation given high fall risk

## 2013-03-20 NOTE — Assessment & Plan Note (Signed)
Severe pulmonary hypertension on echocardiogram April 2014. Etiology is not clear. No smoking history.  Medical management with gentle diuretics, daily with extra Lasix in the afternoon for weight gain or shortness of breath. Would likely not be able to afford revatio TID.

## 2013-03-20 NOTE — Assessment & Plan Note (Signed)
Underlying renal dysfunction, possibly age-related. We'll need to proceed with Lasix gingerly, with extra doses for shortness of breath as tolerated,

## 2013-03-20 NOTE — Telephone Encounter (Signed)
Order on her iron pill

## 2013-03-20 NOTE — Telephone Encounter (Signed)
Spoke with Wendy Sims, per dr Mariah Milling the pt will take ferrous sulfate 325 mg once daily. Script sent to the facility @ [phone]. Also dr Mariah Milling would like the 2 pm prn dosage of furosemide to be for a weight over 110 instead of 115.

## 2013-03-20 NOTE — Patient Instructions (Addendum)
You are doing well. Please take lasix 20 mg daily in the Am  Please take extra lasix at 2 pm as needed for weight gain or shortness of breath  Please start iron pill daily for anemia  Please call us if you have new issues that need to be addressed before your next appt.  Your physician wants you to follow-up in: 3 months.

## 2013-03-20 NOTE — Assessment & Plan Note (Signed)
Significant gait instability. Not a good Coumadin candidate

## 2013-03-30 ENCOUNTER — Emergency Department: Payer: Self-pay | Admitting: Unknown Physician Specialty

## 2013-03-30 ENCOUNTER — Telehealth: Payer: Self-pay | Admitting: Internal Medicine

## 2013-03-30 LAB — COMPREHENSIVE METABOLIC PANEL
Alkaline Phosphatase: 106 U/L (ref 50–136)
Anion Gap: 5 — ABNORMAL LOW (ref 7–16)
BUN: 31 mg/dL — ABNORMAL HIGH (ref 7–18)
Bilirubin,Total: 0.5 mg/dL (ref 0.2–1.0)
Co2: 28 mmol/L (ref 21–32)
Creatinine: 1.27 mg/dL (ref 0.60–1.30)
EGFR (African American): 40 — ABNORMAL LOW
Glucose: 110 mg/dL — ABNORMAL HIGH (ref 65–99)
SGOT(AST): 22 U/L (ref 15–37)
SGPT (ALT): 13 U/L (ref 12–78)
Total Protein: 7.7 g/dL (ref 6.4–8.2)

## 2013-03-30 LAB — CBC
HCT: 31.9 % — ABNORMAL LOW (ref 40.0–52.0)
HGB: 10.5 g/dL — ABNORMAL LOW (ref 12.0–16.0)
RDW: 13.5 % (ref 11.5–14.5)
WBC: 12.7 10*3/uL — ABNORMAL HIGH (ref 3.8–10.6)

## 2013-03-30 LAB — URINALYSIS, COMPLETE
Bacteria: NONE SEEN
Bilirubin,UR: NEGATIVE
Glucose,UR: NEGATIVE mg/dL (ref 0–75)
Hyaline Cast: 3
Ketone: NEGATIVE
Ph: 6 (ref 4.5–8.0)
RBC,UR: 31 /HPF (ref 0–5)
Specific Gravity: 1.01 (ref 1.003–1.030)
Squamous Epithelial: 1
WBC UR: 1 /HPF (ref 0–5)

## 2013-03-30 NOTE — Telephone Encounter (Signed)
Patient Wendy Sims stated she will follow advice will go to ER as advised

## 2013-03-30 NOTE — Telephone Encounter (Signed)
Patient is at [facility] with fever 100.2 reported by daughter and has some SOB no appointments available, advised daughter that patient needs to be seen with on set of fever and SOB, Please advise

## 2013-03-30 NOTE — Telephone Encounter (Signed)
I would recommend that she is seen in the ED. She will be able to have CXR if needed for evaluation there.

## 2013-03-30 NOTE — Telephone Encounter (Signed)
Patient Information:  Caller Name: Riley Churches  Phone: [phone]  Patient: Deyja, Sochacki  Gender: Female  DOB: 1908-10-08  Age: 79 Years  PCP: Ronna Polio (Adults only)  Office Follow Up:  Does the office need to follow up with this patient?: Yes  Instructions For The Office: PLEASE CALL BRENDA BACK, HER PHONE DISCONNECTED, AND 33 YEAR OLD MOTHER HAS 102 FEVER AND HARD TIME BREATHING.  RN Note:  Steward Drone, daughter calling regarding her 69 year old mother's facility called related to her having a hard time breathing. RN/CAN asked if she had a DNR. She did not answer, and her phone breaking up. RN/CAN asked if they tried Tylenol and she stated she takes one every am. Brenda's phone disconnected. RN/CAN tried calling back and left message. RN/CAN did not get how the temperature was taken or more details.  Symptoms  Reason For Call & Symptoms: Daughter called  Reviewed Health History In EMR: N/A  Reviewed Medications In EMR: N/A  Reviewed Allergies In EMR: N/A  Reviewed Surgeries / Procedures: N/A  Date of Onset of Symptoms: 03/30/2013  Any Fever: Yes  Fever Taken: Axillary  Fever Time Of Reading: 08:00:00  Fever Last Reading: 102  Guideline(s) Used:  Breathing Difficulty  No Protocol Available - Sick Adult  Disposition Per Guideline:   Call EMS 911 Now  Reason For Disposition Reached:   Sounds like a life-threatening emergency to the triager  Advice Given:  N/A  RN Overrode Recommendation:  Document Patient  Phone got disconnected from [place], daughter. RN/CAN did get in that ANY breathing difficulty is a 911 disposition if there is no DNR.

## 2013-04-03 ENCOUNTER — Encounter: Payer: Self-pay | Admitting: Internal Medicine

## 2013-04-03 ENCOUNTER — Ambulatory Visit (INDEPENDENT_AMBULATORY_CARE_PROVIDER_SITE_OTHER): Payer: Medicare Other | Admitting: Internal Medicine

## 2013-04-03 VITALS — BP 102/68 | HR 92 | Temp 97.9°F | Wt 109.0 lb

## 2013-04-03 DIAGNOSIS — L738 Other specified follicular disorders: Secondary | ICD-10-CM

## 2013-04-03 DIAGNOSIS — N183 Chronic kidney disease, stage 3 unspecified: Secondary | ICD-10-CM

## 2013-04-03 DIAGNOSIS — L678 Other hair color and hair shaft abnormalities: Secondary | ICD-10-CM

## 2013-04-03 DIAGNOSIS — L739 Follicular disorder, unspecified: Secondary | ICD-10-CM | POA: Insufficient documentation

## 2013-04-03 DIAGNOSIS — D649 Anemia, unspecified: Secondary | ICD-10-CM

## 2013-04-03 DIAGNOSIS — I509 Heart failure, unspecified: Secondary | ICD-10-CM

## 2013-04-03 DIAGNOSIS — I4891 Unspecified atrial fibrillation: Secondary | ICD-10-CM

## 2013-04-03 LAB — CBC WITH DIFFERENTIAL/PLATELET
Basophils Absolute: 0 10*3/uL (ref 0.0–0.1)
Eosinophils Absolute: 0.6 10*3/uL (ref 0.0–0.7)
Hemoglobin: 10.1 g/dL — ABNORMAL LOW (ref 12.0–15.0)
Lymphocytes Relative: 24.3 % (ref 12.0–46.0)
MCHC: 33.7 g/dL (ref 30.0–36.0)
Neutro Abs: 5.2 10*3/uL (ref 1.4–7.7)
RDW: 13.3 % (ref 11.5–14.6)

## 2013-04-03 LAB — COMPREHENSIVE METABOLIC PANEL
ALT: 9 U/L (ref 0–35)
AST: 19 U/L (ref 0–37)
Albumin: 3.5 g/dL (ref 3.5–5.2)
Calcium: 9.8 mg/dL (ref 8.4–10.5)
Chloride: 96 mEq/L (ref 96–112)
Creatinine, Ser: 1.3 mg/dL — ABNORMAL HIGH (ref 0.4–1.2)
Potassium: 4.5 mEq/L (ref 3.5–5.1)
Sodium: 129 mEq/L — ABNORMAL LOW (ref 135–145)
Total Protein: 7.3 g/dL (ref 6.0–8.3)

## 2013-04-03 MED ORDER — GENTAMICIN SULFATE 0.1 % EX CREA: TOPICAL_CREAM | Freq: Three times a day (TID) | CUTANEOUS | Status: DC

## 2013-04-03 NOTE — Assessment & Plan Note (Signed)
Mild anemia noted on recent labs. Patient was started on iron supplementation by her cardiologist. Will recheck CBC with labs today.

## 2013-04-03 NOTE — Assessment & Plan Note (Signed)
Status post recent exacerbation of CHF requiring ER evaluation with IV diuresis. Appears euvolemic on exam today. Will continue furosemide 20 mg one to 2 times daily. If patient develops lower extremities edema or shortness of breath, she will at her second dose of furosemide at 2 PM.Will check renal function with labs today. We'll continue carvedilol. Note that lisinopril was discontinued by her cardiologist.

## 2013-04-03 NOTE — Assessment & Plan Note (Signed)
Folliculitis noted over scalp. She also has some areas that appear to be early actinic keratoses. Dermatology evaluation is scheduled for next week. We'll have her apply gentamycin topically over the infected areas over the next week. She will call or return to clinic if any sensitivity to this medication. Note sensitivity in the past to neomycin.

## 2013-04-03 NOTE — Assessment & Plan Note (Signed)
Will check renal function with labs today. 

## 2013-04-03 NOTE — Progress Notes (Signed)
Subjective:    Patient ID: Wendy Sims, female    DOB: 03-18-1909, 79 y.o.   MRN: [id number]  HPI 79 year old female with history of CHF, age of fibrillation, chronic kidney disease, anemia presents for followup. Last week she developed shortness of breath and was is seen in the ER and given IV Lasix. Chest x-ray at that time was reportedly normal. Symptoms improved and she was discharged home. Her daughter reports she has been doing well. She has not had any lower extremity edema this week. She has not complained of shortness of breath. They have been giving Lasix typically once per day, but on occasion twice per day when she developed lower extremity edema. Her appetite has been good. Her only concern today is an area of soreness on the top of her scalp that has been present for a couple of days. She has not had any recent fever or chills.  Outpatient Encounter Prescriptions as of 04/03/2013  Medication Sig Dispense Refill  . acetaminophen (TYLENOL) 500 MG tablet Take 500 mg by mouth. Every morning as needed      . albuterol (PROVENTIL) (2.5 MG/3ML) 0.083% nebulizer solution Take 2.5 mg by nebulization 4 (four) times daily.       Marland Kitchen aspirin 81 MG tablet Take 81 mg by mouth daily.      . Calcium Polycarbophil (FIBER) 625 MG TABS Take 1 tablet by mouth daily.       . carvedilol (COREG) 3.125 MG tablet Take 1 tablet (3.125 mg total) by mouth 2 (two) times daily with a meal.  180 tablet  3  . ferrous sulfate 325 (65 FE) MG tablet Take 1 tablet (325 mg total) by mouth daily with breakfast.  90 tablet  3  . Fluticasone-Salmeterol (ADVAIR) 100-50 MCG/DOSE AEPB Inhale 1 puff into the lungs every 12 (twelve) hours.  60 each  3  . furosemide (LASIX) 20 MG tablet Take 1 tablet (20 mg total) by mouth 2 (two) times daily.  180 tablet  4  . loratadine (CLARITIN) 10 MG tablet Take 10 mg by mouth daily.      . vitamin B-12 (CYANOCOBALAMIN) 500 MCG tablet Take 1,000 mcg by mouth daily.       Marland Kitchen zolpidem  (AMBIEN) 5 MG tablet Take 0.5 tablets (2.5 mg total) by mouth at bedtime as needed.  90 tablet  0  . gentamicin cream (GARAMYCIN) 0.1 % Apply topically 3 (three) times daily.  15 g  0   No facility-administered encounter medications on file as of 04/03/2013.   BP 102/68  Pulse 92  Temp(Src) 97.9 F (36.6 C) (Oral)  Wt 109 lb (49.442 kg)  BMI 21.29 kg/m2  SpO2 98%  Review of Systems  Constitutional: Negative for fever, chills, appetite change, fatigue and unexpected weight change.  HENT: Negative for ear pain, congestion, sore throat, trouble swallowing, neck pain, voice change and sinus pressure.   Eyes: Negative for visual disturbance.  Respiratory: Negative for cough, shortness of breath, wheezing and stridor.   Cardiovascular: Negative for chest pain, palpitations and leg swelling.  Gastrointestinal: Negative for nausea, vomiting, abdominal pain, diarrhea, constipation, blood in stool, abdominal distention and anal bleeding.  Genitourinary: Negative for dysuria and flank pain.  Musculoskeletal: Negative for myalgias, arthralgias and gait problem.  Skin: Negative for color change and rash.  Neurological: Negative for dizziness and headaches.  Hematological: Negative for adenopathy. Does not bruise/bleed easily.  Psychiatric/Behavioral: Negative for suicidal ideas, sleep disturbance and dysphoric mood. The patient is not nervous/anxious.  Objective:   Physical Exam  Constitutional: She is oriented to person, place, and time. She appears well-developed and well-nourished. No distress.  HENT:  Head: Normocephalic and atraumatic.  Right Ear: External ear normal.  Left Ear: External ear normal.  Nose: Nose normal.  Mouth/Throat: Oropharynx is clear and moist. No oropharyngeal exudate.  Eyes: Conjunctivae are normal. Pupils are equal, round, and reactive to light. Right eye exhibits no discharge. Left eye exhibits no discharge. No scleral icterus.  Neck: Normal range of  motion. Neck supple. No tracheal deviation present. No thyromegaly present.  Cardiovascular: Normal rate, regular rhythm, normal heart sounds and intact distal pulses.  Exam reveals no gallop and no friction rub.   No murmur heard. Pulmonary/Chest: Effort normal and breath sounds normal. No accessory muscle usage. Not tachypneic. No respiratory distress. She has no decreased breath sounds. She has no wheezes. She has no rhonchi. She has no rales. She exhibits no tenderness.  Musculoskeletal: Normal range of motion. She exhibits no edema and no tenderness.  Lymphadenopathy:    She has no cervical adenopathy.  Neurological: She is alert and oriented to person, place, and time. No cranial nerve deficit. She exhibits normal muscle tone. Coordination normal.  Skin: Skin is warm and dry. No rash noted. She is not diaphoretic. There is erythema. No pallor.     Psychiatric: She has a normal mood and affect. Her behavior is normal. Judgment and thought content normal.          Assessment & Plan:

## 2013-04-09 ENCOUNTER — Other Ambulatory Visit: Payer: Self-pay

## 2013-04-09 DIAGNOSIS — R609 Edema, unspecified: Secondary | ICD-10-CM

## 2013-04-09 MED ORDER — FUROSEMIDE 20 MG PO TABS: 20.0000 mg | ORAL_TABLET | Freq: Two times a day (BID) | ORAL | Status: DC

## 2013-04-09 NOTE — Telephone Encounter (Signed)
Refilled Furosemide sent to [facility].

## 2013-04-23 ENCOUNTER — Telehealth: Payer: Self-pay | Admitting: Internal Medicine

## 2013-04-23 ENCOUNTER — Emergency Department: Payer: Self-pay | Admitting: Emergency Medicine

## 2013-04-23 LAB — COMPREHENSIVE METABOLIC PANEL
Alkaline Phosphatase: 94 U/L (ref 50–136)
Anion Gap: 4 — ABNORMAL LOW (ref 7–16)
Bilirubin,Total: 0.4 mg/dL (ref 0.2–1.0)
Chloride: 93 mmol/L — ABNORMAL LOW (ref 98–107)
Co2: 27 mmol/L (ref 21–32)
EGFR (African American): 47 — ABNORMAL LOW
Glucose: 110 mg/dL — ABNORMAL HIGH (ref 65–99)
SGOT(AST): 24 U/L (ref 15–37)
SGPT (ALT): 13 U/L (ref 12–78)
Sodium: 124 mmol/L — ABNORMAL LOW (ref 136–145)
Total Protein: 7.2 g/dL (ref 6.4–8.2)

## 2013-04-23 LAB — URINALYSIS, COMPLETE
Blood: NEGATIVE
Glucose,UR: NEGATIVE mg/dL (ref 0–75)
Hyaline Cast: 5
Ketone: NEGATIVE
Nitrite: NEGATIVE
Protein: NEGATIVE

## 2013-04-23 LAB — CBC
HGB: 10.4 g/dL — ABNORMAL LOW (ref 12.0–16.0)
MCH: 27.5 pg (ref 26.0–34.0)
MCHC: 33.1 g/dL (ref 32.0–36.0)
MCV: 83 fL (ref 80–100)
RBC: 3.77 10*6/uL — ABNORMAL LOW (ref 4.40–5.90)
RDW: 13.3 % (ref 11.5–14.5)
WBC: 13.4 10*3/uL — ABNORMAL HIGH (ref 3.8–10.6)

## 2013-04-23 LAB — LIPASE, BLOOD: Lipase: 119 U/L (ref 73–393)

## 2013-04-23 NOTE — Telephone Encounter (Signed)
The patient has not had a bowel movement in 3 days . She is wanting Dr. Dan Humphreys to call her in an enema at the drug store. Give the daughter a call in reference to this matter.

## 2013-04-23 NOTE — Telephone Encounter (Signed)
I would recommend trying a Fleets enema, which is OTC.

## 2013-04-23 NOTE — Telephone Encounter (Signed)
Fwd to Dr. Dan Humphreys, patient daughter called again and would like something called in to [facility]. Patient is not having a BM and feeling miserable.

## 2013-04-23 NOTE — Telephone Encounter (Signed)
Informed patient daughter Steward Drone and she verbally agreed.

## 2013-06-04 ENCOUNTER — Telehealth: Payer: Self-pay | Admitting: Internal Medicine

## 2013-06-04 NOTE — Telephone Encounter (Signed)
It is for atrial fibrillation. She should continue it.

## 2013-06-04 NOTE — Telephone Encounter (Signed)
Fwd to Dr. Walker 

## 2013-06-04 NOTE — Telephone Encounter (Signed)
Pt is scheduled for Mohs surgery 11/10.  They were told if she takes aspirin for medical reasons she can continue it, but if it is not for medical reasons she can discontinue it 7 days prior to surgery.  Asking if she should discontinue or continue taking the aspirin.  Please advise.

## 2013-06-05 NOTE — Telephone Encounter (Signed)
Left message to call back  

## 2013-06-08 NOTE — Telephone Encounter (Signed)
Left message to call back  

## 2013-06-12 NOTE — Telephone Encounter (Signed)
Informed Wendy Sims's husband Wendy Sims of Dr. Dan Humphreys instructions and he verbally agreed understanding and to relay the message to wife.

## 2013-06-15 DIAGNOSIS — Z85828 Personal history of other malignant neoplasm of skin: Secondary | ICD-10-CM | POA: Insufficient documentation

## 2013-06-22 ENCOUNTER — Encounter: Payer: Self-pay | Admitting: Cardiovascular Disease

## 2013-06-22 ENCOUNTER — Ambulatory Visit (INDEPENDENT_AMBULATORY_CARE_PROVIDER_SITE_OTHER): Payer: Medicare Other | Admitting: Cardiovascular Disease

## 2013-06-22 VITALS — BP 100/70 | HR 77 | Ht 60.0 in | Wt 110.2 lb

## 2013-06-22 DIAGNOSIS — I4891 Unspecified atrial fibrillation: Secondary | ICD-10-CM

## 2013-06-22 DIAGNOSIS — R531 Weakness: Secondary | ICD-10-CM

## 2013-06-22 DIAGNOSIS — R5381 Other malaise: Secondary | ICD-10-CM

## 2013-06-22 DIAGNOSIS — I272 Pulmonary hypertension, unspecified: Secondary | ICD-10-CM

## 2013-06-22 DIAGNOSIS — I10 Essential (primary) hypertension: Secondary | ICD-10-CM

## 2013-06-22 DIAGNOSIS — I2789 Other specified pulmonary heart diseases: Secondary | ICD-10-CM

## 2013-06-22 DIAGNOSIS — R609 Edema, unspecified: Secondary | ICD-10-CM

## 2013-06-22 NOTE — Assessment & Plan Note (Signed)
Chronic atrial fibrillation, rate relatively well controlled. Not a candidate for anticoagulation given high fall risk.

## 2013-06-22 NOTE — Assessment & Plan Note (Signed)
Diastolic dysfunction, diastolic CHF, pulmonary hypertension. We'll suggest she continue on Lasix every morning, takes Lasix in the afternoon for weight 109 pounds or more

## 2013-06-22 NOTE — Assessment & Plan Note (Signed)
Walking with a walker. Significant gait instability. High fall risk

## 2013-06-22 NOTE — Assessment & Plan Note (Signed)
Blood pressure is well controlled on today's visit. No changes made to the medications. 

## 2013-06-22 NOTE — Assessment & Plan Note (Addendum)
She does have trace 1+ pitting edema around her feet and ankles, we have slightly lowered the scale for her PM Lasix from 110 pounds down to 109 pounds

## 2013-06-22 NOTE — Patient Instructions (Signed)
You are doing well. Please take lasix 20 mg daily in the am Take lasix 20 mg at 2 pm as needed for shortness of breath or for weight 109 pounds or more  Please call us if you have new issues that need to be addressed before your next appt.  Your physician wants you to follow-up in: 6 months.  You will receive a reminder letter in the mail two months in advance. If you don't receive a letter, please call our office to schedule the follow-up appointment.

## 2013-06-22 NOTE — Progress Notes (Signed)
Patient ID: Wendy Sims, female    DOB: 02/02/1909, 79 y.o.   MRN: [id number]  HPI Comments: 79 YO female with h/o  Diastolic CHF,  severe pulmonary hypertension , chronic atrial fibrillation, chronic hyponatremia, anemia, CKD (worse with diuresis) presents for follow up.  She has friends that present with her today. She continues to walk with a walker, overall has been relatively stable. Recently started back on Lasix daily for lower extremity edema. She takes Lasix 20 mg daily in the morning, extra Lasix 20 mg at 2 PM for shortness of breath or weight over 110 pounds. Weight typically 109 pounds. Difficulty recently putting on her shoes secondary to ankle swelling.  Not very active at baseline. No chest pain, dyspnea at rest.  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  During hospitalization, diuresed with IV lasix, however had worsening renal function . discharged home to skilled nursing.    Carvedilol was decreased down to 3.125 mg twice a day presumably for low blood pressure   Echocardiogram dated 11/25/2012 shows ejection fraction 50-55%, mild to moderate TR, right ventricular systolic pressure estimated at 75 mmHg  EKG today shows atrial fibrillation with ventricular rate 77 beats per minute     Outpatient Encounter Prescriptions as of 06/22/2013  Medication Sig  . acetaminophen (TYLENOL) 500 MG tablet Take 500 mg by mouth. Every morning as needed  . albuterol (PROVENTIL) (2.5 MG/3ML) 0.083% nebulizer solution Take 2.5 mg by nebulization 4 (four) times daily as needed.   Marland Kitchen aspirin 81 MG tablet Take 81 mg by mouth daily.  . Calcium Polycarbophil (FIBER) 625 MG TABS Take 1 tablet by mouth daily.   . carvedilol (COREG) 3.125 MG tablet Take 1 tablet (3.125 mg total) by mouth 2 (two) times daily with a meal.  . ferrous sulfate 325 (65  FE) MG tablet Take 1 tablet (325 mg total) by mouth daily with breakfast.  . Fluticasone-Salmeterol (ADVAIR) 100-50 MCG/DOSE AEPB Inhale 1 puff into the lungs every 12 (twelve) hours.  . furosemide (LASIX) 20 MG tablet Take 1 tablet (20 mg total) by mouth 2 (two) times daily.  Marland Kitchen ibuprofen (ADVIL,MOTRIN) 200 MG tablet Take 400 mg by mouth every 4 (four) hours as needed.  . loratadine (CLARITIN) 10 MG tablet Take 10 mg by mouth daily.  Marland Kitchen zolpidem (AMBIEN) 5 MG tablet Take 0.5 tablets (2.5 mg total) by mouth at bedtime as needed.    Review of Systems  Constitutional: Negative.   HENT: Negative.   Eyes: Negative.   Respiratory: Positive for shortness of breath.   Cardiovascular: Positive for leg swelling.  Gastrointestinal: Negative.   Endocrine: Negative.   Musculoskeletal: Positive for gait problem.  Skin: Negative.   Allergic/Immunologic: Negative.   Neurological: Negative.   Hematological: Negative.   Psychiatric/Behavioral: Negative.   All other systems reviewed and are negative.    BP 100/70  Pulse 77  Ht 5' (1.524 m)  Wt 110 lb 4 oz (50.009 kg)  BMI 21.53 kg/m2  Physical Exam  Nursing note and vitals reviewed. Constitutional: She is oriented to person, place, and time. She appears well-developed and well-nourished.  HENT:  Head: Normocephalic.  Nose: Nose normal.  Mouth/Throat: Oropharynx is clear and moist.  Eyes: Conjunctivae are normal. Pupils are equal, round, and reactive to light.  Neck: Normal range of motion. Neck supple. No JVD present.  Cardiovascular:  Normal rate, regular rhythm, S1 normal, S2 normal, normal heart sounds and intact distal pulses.  Exam reveals no gallop and no friction rub.   No murmur heard. Pulmonary/Chest: Effort normal and breath sounds normal. No respiratory distress. She has no wheezes. She has no rales. She exhibits no tenderness.  Abdominal: Soft. Bowel sounds are normal. She exhibits no distension. There is no tenderness.   Musculoskeletal: Normal range of motion. She exhibits no edema and no tenderness.  Lymphadenopathy:    She has no cervical adenopathy.  Neurological: She is alert and oriented to person, place, and time. Coordination normal.  Skin: Skin is warm and dry. No rash noted. No erythema.  Psychiatric: She has a normal mood and affect. Her behavior is normal. Judgment and thought content normal.    Assessment and Plan

## 2013-07-08 ENCOUNTER — Ambulatory Visit: Payer: Medicare Other | Admitting: Internal Medicine

## 2013-07-20 ENCOUNTER — Ambulatory Visit: Payer: Medicare Other | Admitting: Internal Medicine

## 2013-07-22 ENCOUNTER — Telehealth: Payer: Self-pay | Admitting: Internal Medicine

## 2013-07-22 MED ORDER — DIPHENHYDRAMINE HCL 25 MG PO CAPS: 25.0000 mg | ORAL_CAPSULE | Freq: Three times a day (TID) | ORAL | Status: DC | PRN

## 2013-07-22 MED ORDER — PHENYLEPHRINE-APAP-GUAIFENESIN 5-325-200 MG PO TABS: 1.0000 | ORAL_TABLET | Freq: Four times a day (QID) | ORAL | Status: DC | PRN

## 2013-07-22 MED ORDER — DEXTROMETHORPHAN POLISTIREX 30 MG/5ML PO LQCR: 30.0000 mg | Freq: Two times a day (BID) | ORAL | Status: DC

## 2013-07-22 NOTE — Telephone Encounter (Signed)
Steward Drone had called and left a message that patient had cough and congestion. Returned call, trying to reach caller x3 but unable to do so.  Left message for her to call office back.

## 2013-07-22 NOTE — Telephone Encounter (Signed)
[facility] requires written OTC Rxs

## 2013-07-22 NOTE — Telephone Encounter (Signed)
Spoke with Steward Drone, pt is in her second week of radiation, has two more treatments scheduled this week. Pt is having runny nose congestion. Productive cough with clear sputum. Denies fever, aches, chills. Pt scheduled to go out of town this Sunday. Pt is at [facility]. Pt's daughter wants to know what she can give pt for symptoms.

## 2013-07-22 NOTE — Telephone Encounter (Signed)
Pt's daughter notified, will come to pick up Rxs now. Left up front for pickup. Advised to call back if patient's symptoms worsen or persist, verbalized understanding.

## 2013-07-22 NOTE — Telephone Encounter (Signed)
i have printed rx for sudafed pe,  deslym  And benadryl .  Send ot [facility].  If they have a prn list they wourl rather me choose from ,  requst it

## 2013-07-31 ENCOUNTER — Other Ambulatory Visit: Payer: Self-pay | Admitting: Internal Medicine

## 2013-08-07 ENCOUNTER — Encounter: Payer: Self-pay | Admitting: Internal Medicine

## 2013-08-07 ENCOUNTER — Ambulatory Visit (INDEPENDENT_AMBULATORY_CARE_PROVIDER_SITE_OTHER): Payer: Medicare Other | Admitting: Internal Medicine

## 2013-08-07 VITALS — BP 130/74 | HR 84 | Temp 97.9°F | Wt 106.0 lb

## 2013-08-07 DIAGNOSIS — G4700 Insomnia, unspecified: Secondary | ICD-10-CM

## 2013-08-07 DIAGNOSIS — N183 Chronic kidney disease, stage 3 unspecified: Secondary | ICD-10-CM

## 2013-08-07 DIAGNOSIS — I509 Heart failure, unspecified: Secondary | ICD-10-CM

## 2013-08-07 DIAGNOSIS — I10 Essential (primary) hypertension: Secondary | ICD-10-CM

## 2013-08-07 DIAGNOSIS — Z85828 Personal history of other malignant neoplasm of skin: Secondary | ICD-10-CM

## 2013-08-07 LAB — COMPREHENSIVE METABOLIC PANEL
ALK PHOS: 62 U/L (ref 39–117)
ALT: 9 U/L (ref 0–35)
AST: 18 U/L (ref 0–37)
Albumin: 4 g/dL (ref 3.5–5.2)
BILIRUBIN TOTAL: 0.5 mg/dL (ref 0.3–1.2)
BUN: 38 mg/dL — ABNORMAL HIGH (ref 6–23)
CO2: 25 meq/L (ref 19–32)
CREATININE: 1.2 mg/dL (ref 0.4–1.2)
Calcium: 9.5 mg/dL (ref 8.4–10.5)
Chloride: 99 mEq/L (ref 96–112)
GFR: 42.84 mL/min — AB (ref >60.00)
Glucose, Bld: 92 mg/dL (ref 70–99)
Potassium: 4.7 mEq/L (ref 3.5–5.1)
SODIUM: 132 meq/L — AB (ref 135–145)
TOTAL PROTEIN: 7.6 g/dL (ref 6.0–8.3)

## 2013-08-07 MED ORDER — TRAZODONE HCL 50 MG PO TABS: 25.0000 mg | ORAL_TABLET | Freq: Every evening | ORAL | Status: DC | PRN

## 2013-08-07 NOTE — Assessment & Plan Note (Signed)
Symptomatically doing well. Euvolemic on exam today. Will continue current medications.

## 2013-08-07 NOTE — Progress Notes (Signed)
Subjective:    Patient ID: Wendy Sims, female    DOB: Jan 04, 1909, 80 y.o.   MRN: [id number]  HPI 80 YO female with CHF, atrial fib, recent h/o SCC scalp s/p IMRT at [facility] presents for follow up. Generally feeling well. No concerns today other than she wants to limit her medications.  SCC - Reviewed notes from [facility]. Initial attempt was made at resection of SCC, however lesion had immediate recurrence. Seen at [facility] and had IMRT to lesion. Tolerated well. Minimal erythema noted around site.  CHF - no recent symptoms of chest pain, dyspnea. Compliant with medications.  Insomnia - Chronic insomnia on Ambien. Recently notified that insurance will not cover ambien. Would like to consider less expensive alternative.  Outpatient Encounter Prescriptions as of 08/07/2013  Medication Sig  . acetaminophen (TYLENOL) 500 MG tablet Take 500 mg by mouth. Take 1 every morning, 1 in the evening as needed and 2 at night as needed  . albuterol (PROVENTIL) (2.5 MG/3ML) 0.083% nebulizer solution Take 2.5 mg by nebulization 4 (four) times daily as needed.   Marland Kitchen aspirin 81 MG tablet Take 81 mg by mouth daily.  . Calcium Polycarbophil (FIBER) 625 MG TABS Take 1 tablet by mouth daily.   . carvedilol (COREG) 3.125 MG tablet Take 1 tablet (3.125 mg total) by mouth 2 (two) times daily with a meal.  . ferrous sulfate 325 (65 FE) MG tablet Take 1 tablet (325 mg total) by mouth daily with breakfast.  . furosemide (LASIX) 20 MG tablet Take 1 tablet (20 mg total) by mouth 2 (two) times daily.  Marland Kitchen loratadine (CLARITIN) 10 MG tablet Take 10 mg by mouth daily.  Marland Kitchen ADVAIR DISKUS 100-50 MCG/DOSE AEPB INHALE 1 PUFF BY MOUTH EVERY 12 HOURS. RINSE MOUTH AFTER USING  . traZODone (DESYREL) 50 MG tablet Take 0.5-1 tablets (25-50 mg total) by mouth at bedtime as needed for sleep.  . [DISCONTINUED] zolpidem (AMBIEN) 5 MG tablet Take 0.5 tablets (2.5 mg total) by mouth at bedtime as needed.    Review of Systems    Constitutional: Negative for fever, chills, appetite change, fatigue and unexpected weight change.  HENT: Negative for congestion, ear pain, sinus pressure, sore throat, trouble swallowing and voice change.   Eyes: Negative for visual disturbance.  Respiratory: Negative for cough, shortness of breath, wheezing and stridor.   Cardiovascular: Negative for chest pain, palpitations and leg swelling.  Gastrointestinal: Negative for nausea, vomiting, abdominal pain, diarrhea, constipation, blood in stool, abdominal distention and anal bleeding.  Genitourinary: Negative for dysuria and flank pain.  Musculoskeletal: Negative for arthralgias, gait problem, myalgias and neck pain.  Skin: Negative for color change and rash.  Neurological: Negative for dizziness and headaches.  Hematological: Negative for adenopathy. Does not bruise/bleed easily.  Psychiatric/Behavioral: Negative for suicidal ideas, sleep disturbance and dysphoric mood. The patient is not nervous/anxious.        Objective:   Physical Exam  Constitutional: She is oriented to person, place, and time. She appears well-developed and well-nourished. No distress.  HENT:  Head: Normocephalic and atraumatic.  Right Ear: External ear normal.  Left Ear: External ear normal.  Nose: Nose normal.  Mouth/Throat: Oropharynx is clear and moist. No oropharyngeal exudate.  Eyes: Conjunctivae are normal. Pupils are equal, round, and reactive to light. Right eye exhibits no discharge. Left eye exhibits no discharge. No scleral icterus.  Neck: Normal range of motion. Neck supple. No tracheal deviation present. No thyromegaly present.  Cardiovascular: Normal rate, regular rhythm, normal  heart sounds and intact distal pulses.  Exam reveals no gallop and no friction rub.   No murmur heard. Pulmonary/Chest: Effort normal and breath sounds normal. No accessory muscle usage. Not tachypneic. No respiratory distress. She has no decreased breath sounds. She has  no wheezes. She has no rhonchi. She has no rales. She exhibits no tenderness.  Musculoskeletal: Normal range of motion. She exhibits no edema and no tenderness.  Lymphadenopathy:    She has no cervical adenopathy.  Neurological: She is alert and oriented to person, place, and time. No cranial nerve deficit. She exhibits normal muscle tone. Coordination normal.  Skin: Skin is warm and dry. Lesion (scaling plaque mid scalp measuring approx 5cm diameter with surrounding erythema) noted. No rash noted. She is not diaphoretic. No erythema. No pallor.  Psychiatric: She has a normal mood and affect. Her behavior is normal. Judgment and thought content normal.          Assessment & Plan:

## 2013-08-07 NOTE — Assessment & Plan Note (Signed)
S/p resection of SCC scalp, with immediate recurrence. [facility] Dr[facility] Genella MechBrizel. Reviewed notes from [facility] today. Tolerated XRT well. Will continue to monitor.

## 2013-08-07 NOTE — Progress Notes (Signed)
Pre-visit discussion using our clinic review tool. No additional management support is needed unless otherwise documented below in the visit note.  

## 2013-08-07 NOTE — Assessment & Plan Note (Signed)
Renal function stable on labs today.

## 2013-08-07 NOTE — Assessment & Plan Note (Signed)
BP Readings from Last 3 Encounters:  08/07/13 130/74  06/22/13 100/70  04/03/13 102/68   BP well controlled on current medications. Renal function stable on labs today. Will continue.

## 2013-08-07 NOTE — Assessment & Plan Note (Signed)
Chronic insomnia, has done well on Ambien, however insurance will no longer cover. Will give trial of Trazodone. If symptoms poorly controlled, then pt family will consider paying cash for Ambien.

## 2013-09-03 ENCOUNTER — Telehealth: Payer: Self-pay | Admitting: Internal Medicine

## 2013-09-03 NOTE — Telephone Encounter (Signed)
Riley ChurchesBrenda Hill calling to state pt does not want to take iron.  Wants to switch back to [employer].  States the facility has to have an order.  Steward DroneBrenda needs to know what the pt needs to take so [facility] has the right order.  Pt is refusing to take the iron because she states her brother could not take it.  If pt can take Fibercon asking if we can send order to [facility].

## 2013-09-07 NOTE — Telephone Encounter (Signed)
Iron and Fibercon two very different meds. Iron was for her anemia. Fibercon is a bulk forming laxative. Fibercon is not going to help her anemia.

## 2013-09-07 NOTE — Telephone Encounter (Signed)
Would it be ok for her to take the Fibercon instead of iron, if so then please clarify in this encounter and I will fax it to [facility].

## 2013-09-08 NOTE — Telephone Encounter (Signed)
Called left detailed message on daughter's Steward DroneBrenda voicemail with information as noted below.

## 2013-09-09 NOTE — Telephone Encounter (Signed)
No return call 

## 2013-09-15 NOTE — Telephone Encounter (Signed)
Patient daughter returned call today, she state her mother was given Fibercon by another physician to help with loose stools because it has the adverse effect on her. Patient refused to take Iron, everytime she eat when she would go to the bathroom. And to her mother the [employer]ibercon does not work as a laxative, it helps so she will not go to the bathroom too often. She has not heard anything else back from [facility] so she does need any orders right now, just wanted to make us aware of this.

## 2013-10-29 ENCOUNTER — Other Ambulatory Visit: Payer: Self-pay | Admitting: Internal Medicine

## 2013-11-05 ENCOUNTER — Encounter: Payer: Self-pay | Admitting: Internal Medicine

## 2013-11-05 ENCOUNTER — Ambulatory Visit (INDEPENDENT_AMBULATORY_CARE_PROVIDER_SITE_OTHER): Payer: Medicare Other | Admitting: Internal Medicine

## 2013-11-05 VITALS — BP 108/78 | HR 84 | Temp 97.6°F | Wt 105.0 lb

## 2013-11-05 DIAGNOSIS — G4700 Insomnia, unspecified: Secondary | ICD-10-CM

## 2013-11-05 DIAGNOSIS — D649 Anemia, unspecified: Secondary | ICD-10-CM

## 2013-11-05 DIAGNOSIS — R6889 Other general symptoms and signs: Secondary | ICD-10-CM

## 2013-11-05 DIAGNOSIS — N183 Chronic kidney disease, stage 3 unspecified: Secondary | ICD-10-CM

## 2013-11-05 DIAGNOSIS — I10 Essential (primary) hypertension: Secondary | ICD-10-CM

## 2013-11-05 LAB — CBC WITH DIFFERENTIAL/PLATELET
BASOS ABS: 0 10*3/uL (ref 0.0–0.1)
BASOS PCT: 0.3 % (ref 0.0–3.0)
Eosinophils Absolute: 0.8 10*3/uL — ABNORMAL HIGH (ref 0.0–0.7)
Eosinophils Relative: 8.1 % — ABNORMAL HIGH (ref 0.0–5.0)
HEMATOCRIT: 35.2 % — AB (ref 36.0–46.0)
HEMOGLOBIN: 11.7 g/dL — AB (ref 12.0–15.0)
LYMPHS ABS: 2.2 10*3/uL (ref 0.7–4.0)
Lymphocytes Relative: 21.2 % (ref 12.0–46.0)
MCHC: 33.2 g/dL (ref 30.0–36.0)
MCV: 87.2 fl (ref 78.0–100.0)
Monocytes Absolute: 0.8 10*3/uL (ref 0.1–1.0)
Monocytes Relative: 7.4 % (ref 3.0–12.0)
NEUTROS ABS: 6.6 10*3/uL (ref 1.4–7.7)
Neutrophils Relative %: 63 % (ref 43.0–77.0)
Platelets: 181 10*3/uL (ref 150.0–400.0)
RBC: 4.03 Mil/uL (ref 3.87–5.11)
RDW: 14.3 % (ref 11.5–14.6)
WBC: 10.4 10*3/uL (ref 4.5–10.5)

## 2013-11-05 LAB — COMPREHENSIVE METABOLIC PANEL
ALT: 11 U/L (ref 0–35)
AST: 17 U/L (ref 0–37)
Albumin: 3.8 g/dL (ref 3.5–5.2)
Alkaline Phosphatase: 63 U/L (ref 39–117)
BILIRUBIN TOTAL: 0.4 mg/dL (ref 0.3–1.2)
BUN: 40 mg/dL — AB (ref 6–23)
CO2: 22 meq/L (ref 19–32)
Calcium: 9.6 mg/dL (ref 8.4–10.5)
Chloride: 101 mEq/L (ref 96–112)
Creatinine, Ser: 1.3 mg/dL — ABNORMAL HIGH (ref 0.4–1.2)
GFR: 38.42 mL/min — AB (ref >60.00)
Glucose, Bld: 89 mg/dL (ref 70–99)
Potassium: 4.6 mEq/L (ref 3.5–5.1)
Sodium: 135 mEq/L (ref 135–145)
Total Protein: 7.5 g/dL (ref 6.0–8.3)

## 2013-11-05 LAB — TSH: TSH: 2.83 u[IU]/mL (ref 0.35–5.50)

## 2013-11-05 LAB — FERRITIN: FERRITIN: 144.4 ng/mL (ref 10.0–291.0)

## 2013-11-05 LAB — VITAMIN B12: Vitamin B-12: >1500 pg/mL — ABNORMAL HIGH (ref 211–911)

## 2013-11-05 MED ORDER — ZOLPIDEM TARTRATE 5 MG PO TABS: 2.5000 mg | ORAL_TABLET | Freq: Every evening | ORAL | Status: DC | PRN

## 2013-11-05 NOTE — Assessment & Plan Note (Signed)
BP Readings from Last 3 Encounters:  11/05/13 108/78  08/07/13 130/74  06/22/13 100/70   BP well controlled on current medications.

## 2013-11-05 NOTE — Assessment & Plan Note (Signed)
Previous history of anemia. Patient refusing to take iron supplementation. Will recheck CBC with labs today.

## 2013-11-05 NOTE — Assessment & Plan Note (Signed)
Will check renal function with labs today. 

## 2013-11-05 NOTE — Assessment & Plan Note (Signed)
Chronic insomnia. Previously well controlled on Ambien. Poorly controlled on Trazodone. Will restart Ambien 2.5mg  daily.

## 2013-11-05 NOTE — Progress Notes (Signed)
Pre visit review using our clinic review tool, if applicable. No additional management support is needed unless otherwise documented below in the visit note. 

## 2013-11-05 NOTE — Progress Notes (Signed)
Subjective:    Patient ID: Wendy Sims, female    DOB: 1908-12-08, 80 y.o.   MRN: [id number]  HPI 80YO female presents for follow up.  Insomnia - symptoms of insomnia have worsened with stopping Ambien. No improvement with trazodone. Patient requests to go back on Ambien. She is feeling fatigued during the day. Not sleeping well at night.  Otherwise, doing well. Appetite good. No change in bowel habits. Feels "as happy as she can be."   No recent shortness of breath, leg swelling.  Refusing to take Claritin.  Review of Systems  Constitutional: Negative for fever, chills, appetite change, fatigue and unexpected weight change.  HENT: Negative for congestion, ear pain, sinus pressure, sore throat, trouble swallowing and voice change.   Eyes: Negative for visual disturbance.  Respiratory: Negative for cough, shortness of breath, wheezing and stridor.   Cardiovascular: Negative for chest pain, palpitations and leg swelling.  Gastrointestinal: Negative for nausea, vomiting, abdominal pain, diarrhea, constipation, blood in stool, abdominal distention and anal bleeding.  Genitourinary: Negative for dysuria and flank pain.  Musculoskeletal: Negative for arthralgias, gait problem, myalgias and neck pain.  Skin: Negative for color change and rash.  Neurological: Negative for dizziness and headaches.  Hematological: Negative for adenopathy. Does not bruise/bleed easily.  Psychiatric/Behavioral: Positive for sleep disturbance. Negative for suicidal ideas and dysphoric mood. The patient is not nervous/anxious.        Objective:    BP 108/78  Pulse 84  Temp(Src) 97.6 F (36.4 C) (Oral)  Wt 105 lb (47.628 kg)  SpO2 96% Physical Exam  Constitutional: She is oriented to person, place, and time. She appears well-developed and well-nourished. No distress.  HENT:  Head: Normocephalic and atraumatic.  Right Ear: External ear normal. Decreased hearing is noted.  Left Ear: External ear  normal. Decreased hearing is noted.  Nose: Nose normal.  Mouth/Throat: Oropharynx is clear and moist. No oropharyngeal exudate.  Eyes: Conjunctivae are normal. Pupils are equal, round, and reactive to light. Right eye exhibits no discharge. Left eye exhibits no discharge. No scleral icterus.  Neck: Normal range of motion. Neck supple. No tracheal deviation present. No thyromegaly present.  Cardiovascular: Normal rate, regular rhythm and intact distal pulses.   Extrasystoles are present. Exam reveals no gallop and no friction rub.   Murmur heard. Pulmonary/Chest: Effort normal and breath sounds normal. No accessory muscle usage. Not tachypneic. No respiratory distress. She has no decreased breath sounds. She has no wheezes. She has no rhonchi. She has no rales. She exhibits no tenderness.  Musculoskeletal: Normal range of motion. She exhibits no edema and no tenderness.  Lymphadenopathy:    She has no cervical adenopathy.  Neurological: She is alert and oriented to person, place, and time. No cranial nerve deficit. She exhibits normal muscle tone. Coordination normal.  Skin: Skin is warm and dry. No rash noted. She is not diaphoretic. No erythema. No pallor.  Psychiatric: She has a normal mood and affect. Her behavior is normal. Judgment and thought content normal.          Assessment & Plan:   Problem List Items Addressed This Visit   Anemia     Previous history of anemia. Patient refusing to take iron supplementation. Will recheck CBC with labs today.    Relevant Medications      vitamin B-12 (CYANOCOBALAMIN) 1000 MCG tablet   Other Relevant Orders      CBC w/Diff      Ferritin      B12  Chronic kidney disease (CKD), stage III (moderate)     Will check renal function with labs today.    Relevant Orders      Comprehensive metabolic panel   Hypertension      BP Readings from Last 3 Encounters:  11/05/13 108/78  08/07/13 130/74  06/22/13 100/70   BP well controlled on  current medications.    Insomnia - Primary     Chronic insomnia. Previously well controlled on Ambien. Poorly controlled on Trazodone. Will restart Ambien 2.5mg  daily.    Relevant Medications      zolpidem (AMBIEN)  tablet    Other Visit Diagnoses   Cold intolerance        Relevant Orders       TSH        Return in about 3 months (around 02/04/2014) for Recheck.

## 2013-11-09 ENCOUNTER — Encounter: Payer: Self-pay | Admitting: *Deleted

## 2013-11-09 ENCOUNTER — Telehealth: Payer: Self-pay | Admitting: Internal Medicine

## 2013-11-09 NOTE — Telephone Encounter (Signed)
Relevant patient education assigned to patient using Emmi. ° °

## 2013-11-21 IMAGING — CR DG CHEST 1V PORT
1 series · 1 of 1 positions shown · non-contrast
Comparison: none

REASON FOR EXAM: short of breath
COMMENTS:

[ap]
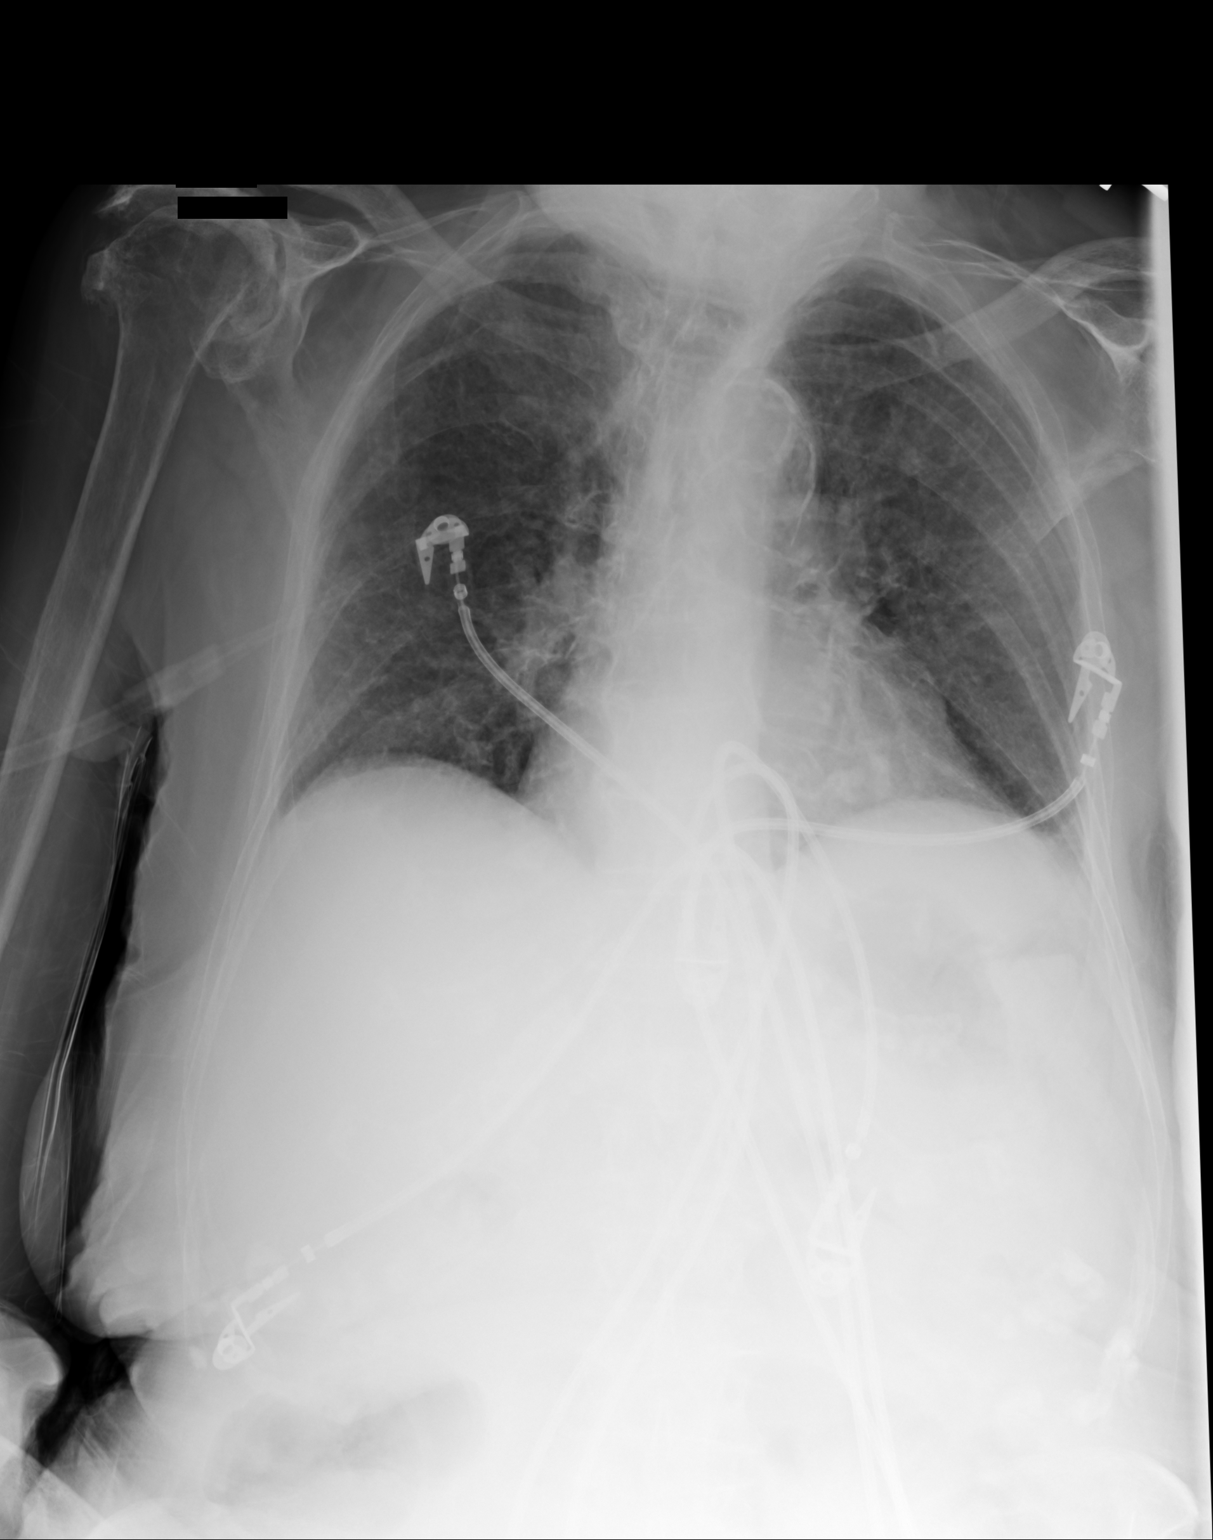

[1 of 1 positions shown; findings below may reference images not displayed]

PROCEDURE:     DXR - DXR PORTABLE CHEST SINGLE VIEW  - February 23, 2013 [DATE]

RESULT:     Comparison is made to the study November 24, 2012.

The lungs are borderline hypoinflated. The interstitial markings are less
prominent today. The cardiac silhouette is enlarged. The pulmonary
vascularity is somewhat indistinct. There is no pleural effusion or alveolar
infiltrate. There are degenerative changes of the right shoulder.
IMPRESSION: 1. The findings suggest low-grade compensated CHF. There has been overall
improvement in the appearance of the pulmonary interstitium since [DATE]. No focal pneumonia is demonstrated. When the patient can tolerate the
procedure, a PA and lateral chest x-ray would be of value in evaluating the
retrocardiac regions.

[REDACTED]

## 2013-11-23 IMAGING — US US RENAL KIDNEY
1 series · 14 of 25 positions shown · non-contrast
Comparison: none

REASON FOR EXAM: acute renal failure, CKD stage 3
COMMENTS:

[Series 1: us renal kidney · 0.20mm/px · 14 of 55 slices shown]
[im 1/55]
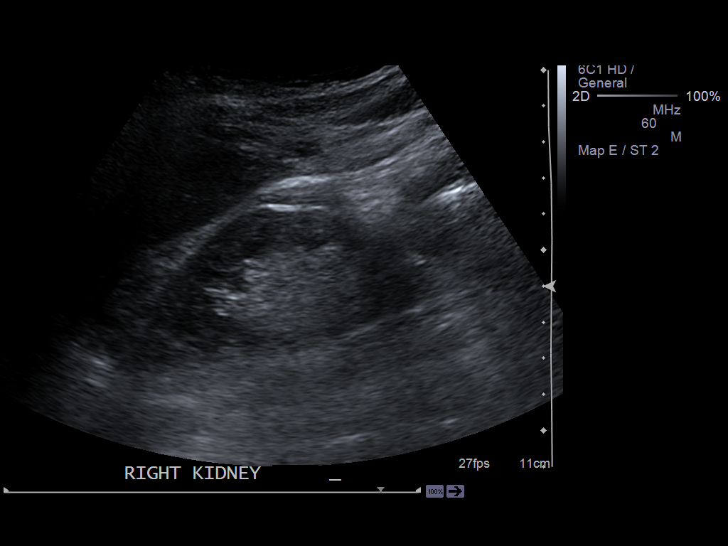
[im 5/55]
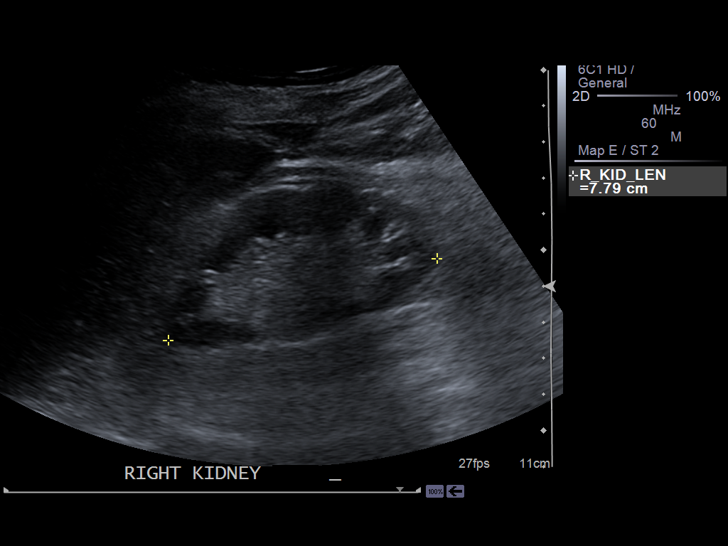
[im 10/55]
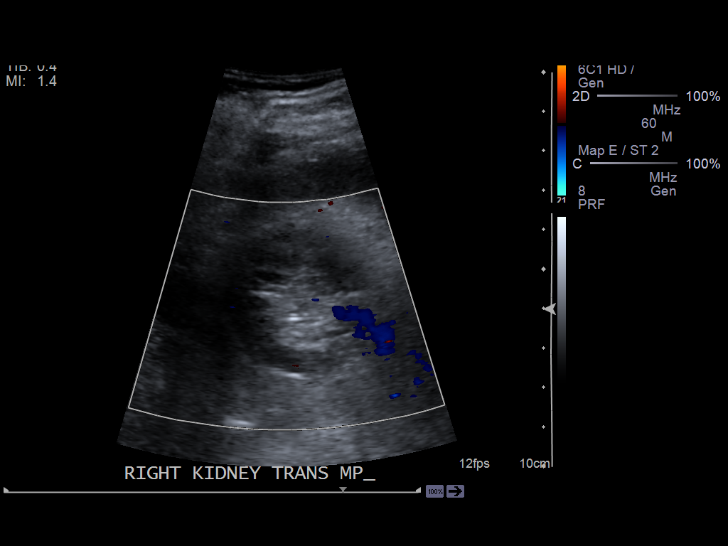
[im 14/55]
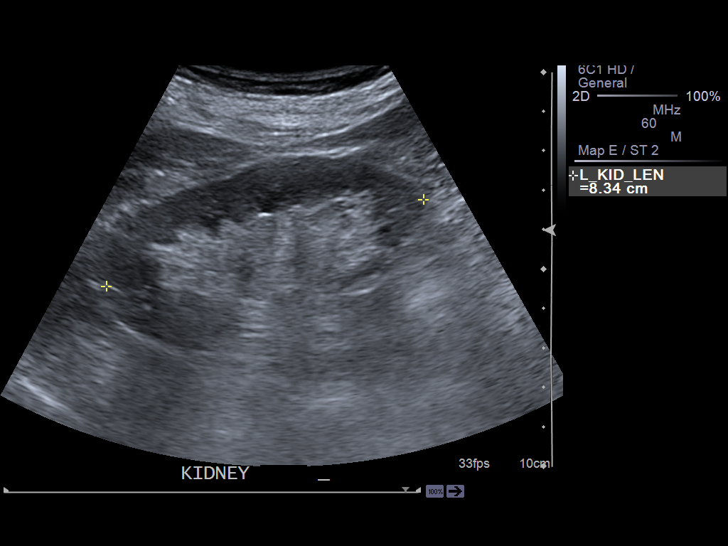
[im 19/55]
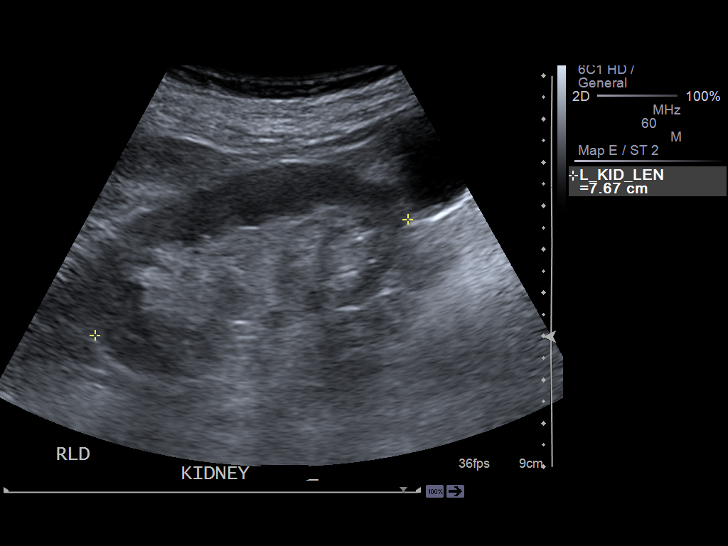
[im 21/55]
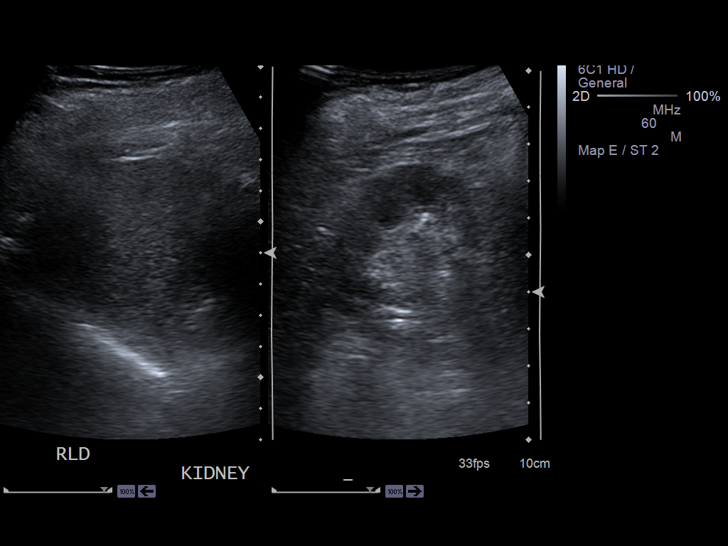
[im 25/55]
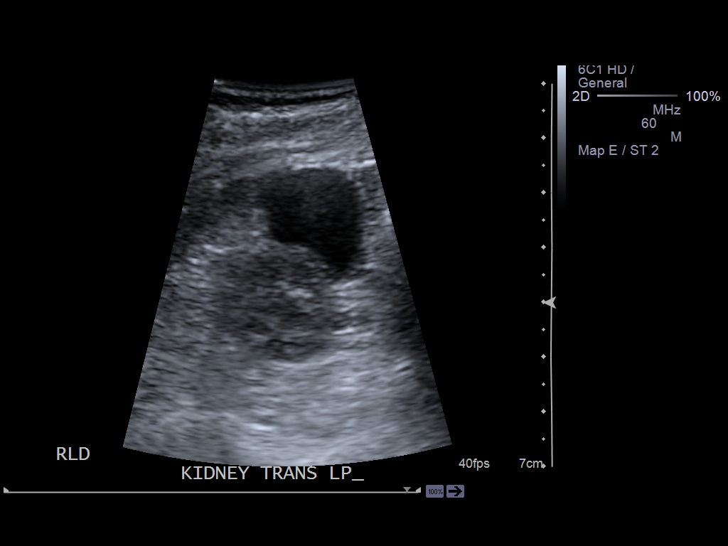
[im 30/55]
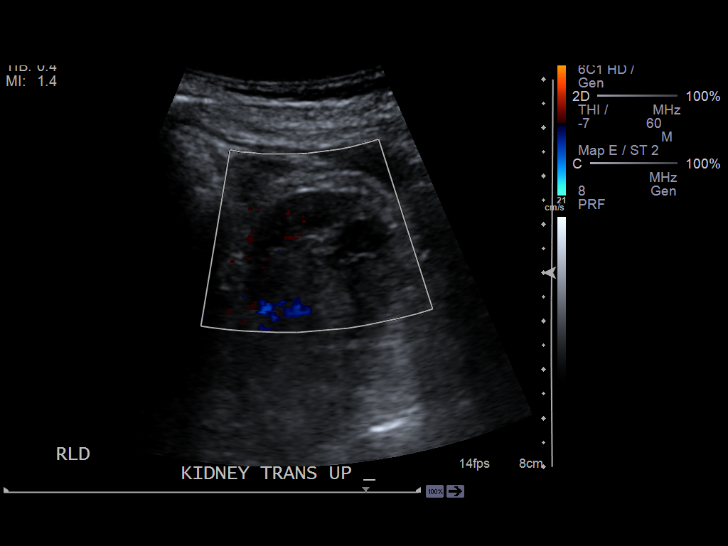
[im 34/55]
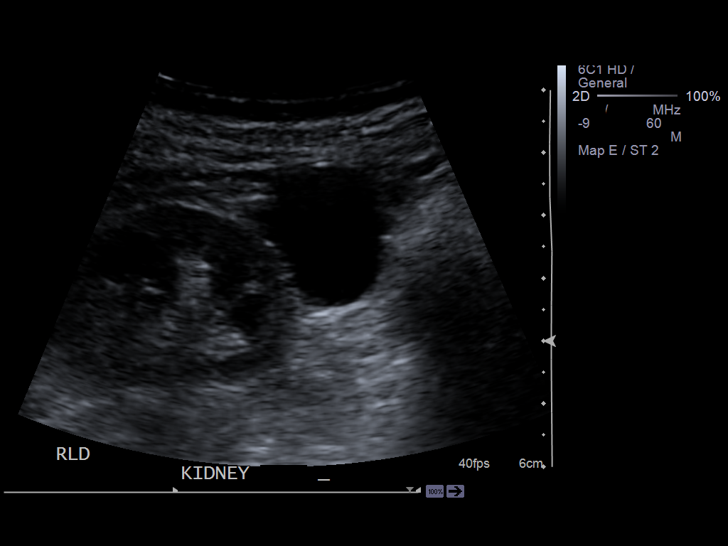
[im 37/55]
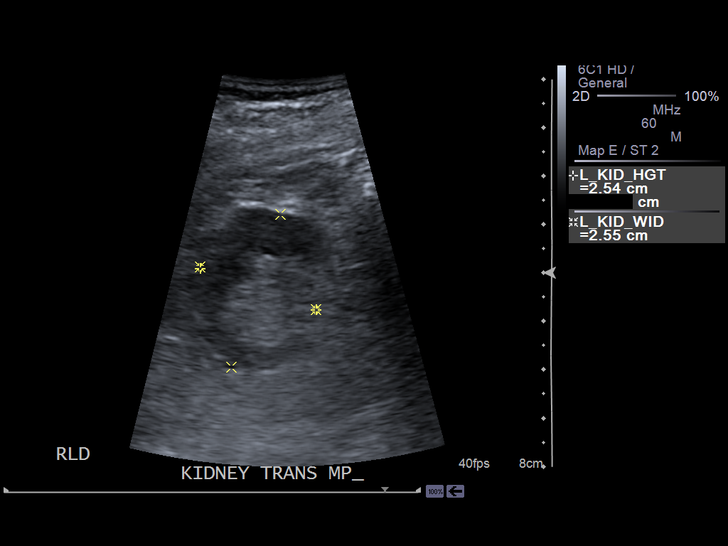
[im 41/55]
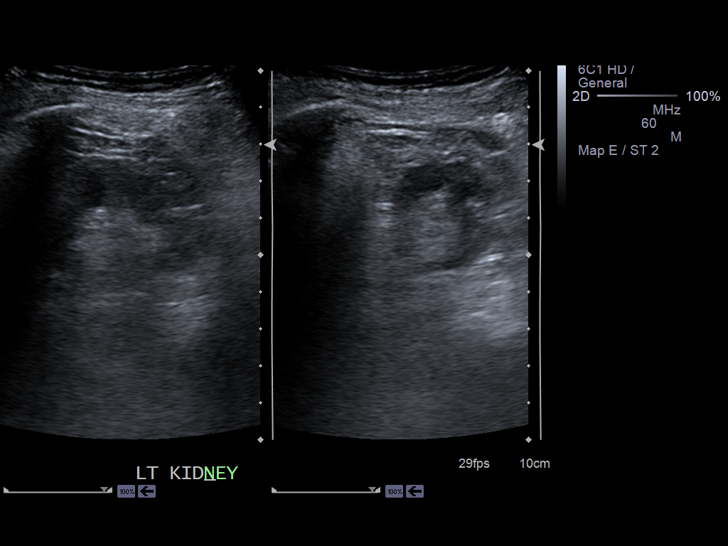
[im 46/55]
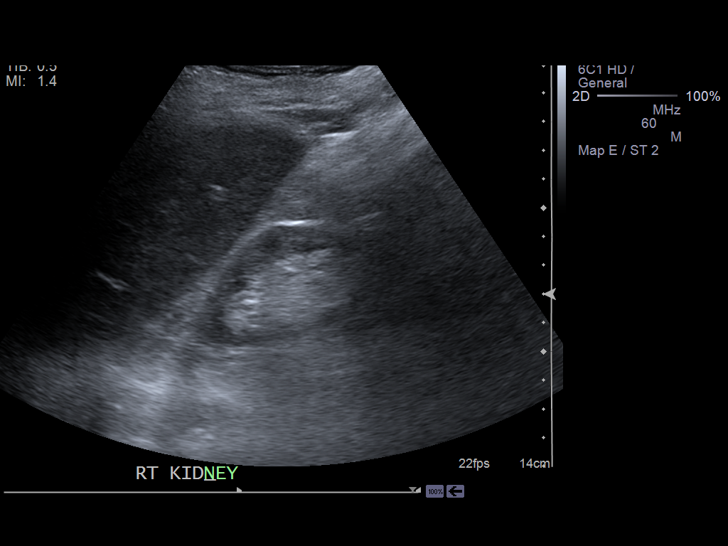
[im 50/55]
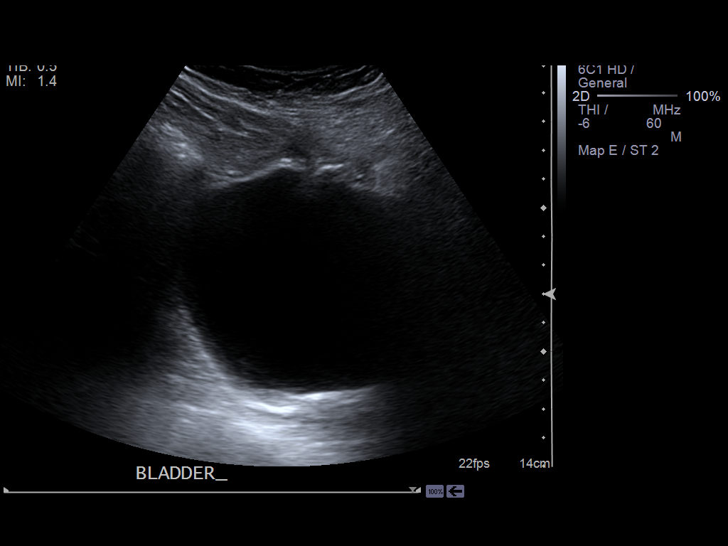
[im 55/55]
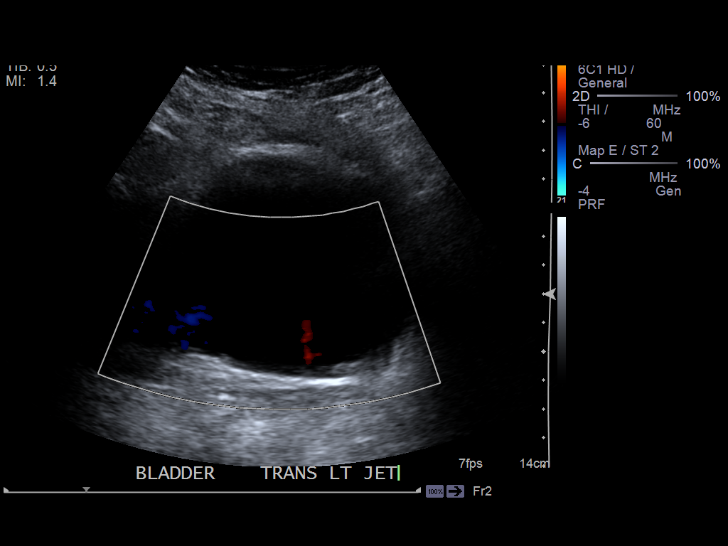

[14 of 25 positions shown; findings below may reference images not displayed]

PROCEDURE:     US  - US KIDNEY  - February 25, 2013  [DATE]

RESULT:     The kidneys demonstrate mild age-appropriate atrophy. The right
kidney measures 8.3 cm in length and the left kidney 7.9 cm in length. The
echotexture of the renal cortex is approximately equal to that of the
adjacent liver. There is no hydronephrosis. There are cysts present in the
left kidney. One measures 1.3 cm in greatest dimension and is in the upper
pole while the second is in the lower pole and measures 2.2 cm in greatest
dimension. A left ureteral jet is demonstrated there are right of ureteral
jet was not demonstrated.

[REDACTED]
IMPRESSION: 1. There is no evidence of hydronephrosis.
2. There are simple appearing cysts in the left kidney.
3. There is mild age-appropriate atrophy both kidneys. Mildly increased
cortical echotexture is noted in both kidneys.

[REDACTED]

## 2013-12-22 ENCOUNTER — Encounter: Payer: Self-pay | Admitting: Cardiovascular Disease

## 2013-12-22 ENCOUNTER — Ambulatory Visit (INDEPENDENT_AMBULATORY_CARE_PROVIDER_SITE_OTHER): Payer: Medicare Other | Admitting: Cardiovascular Disease

## 2013-12-22 VITALS — BP 120/82 | HR 84 | Ht 59.0 in | Wt 104.0 lb

## 2013-12-22 DIAGNOSIS — I2789 Other specified pulmonary heart diseases: Secondary | ICD-10-CM

## 2013-12-22 DIAGNOSIS — N183 Chronic kidney disease, stage 3 unspecified: Secondary | ICD-10-CM

## 2013-12-22 DIAGNOSIS — R0602 Shortness of breath: Secondary | ICD-10-CM

## 2013-12-22 DIAGNOSIS — I4891 Unspecified atrial fibrillation: Secondary | ICD-10-CM

## 2013-12-22 DIAGNOSIS — F329 Major depressive disorder, single episode, unspecified: Secondary | ICD-10-CM

## 2013-12-22 DIAGNOSIS — I272 Pulmonary hypertension, unspecified: Secondary | ICD-10-CM

## 2013-12-22 DIAGNOSIS — F3289 Other specified depressive episodes: Secondary | ICD-10-CM

## 2013-12-22 NOTE — Assessment & Plan Note (Signed)
Appears somewhat depressed on today's visit. She reports having general malaise most days

## 2013-12-22 NOTE — Patient Instructions (Signed)
You are doing well. No medication changes were made.  Please call us if you have new issues that need to be addressed before your next appt.  Your physician wants you to follow-up in: 6 months.  You will receive a reminder letter in the mail two months in advance. If you don't receive a letter, please call our office to schedule the follow-up appointment.   

## 2013-12-22 NOTE — Assessment & Plan Note (Signed)
Creatinine appears relatively stable, mildly elevated

## 2013-12-22 NOTE — Assessment & Plan Note (Signed)
Heart rate relatively well-controlled. Not a good candidate for anticoagulation. She is a high fall risk.

## 2013-12-22 NOTE — Progress Notes (Signed)
Patient ID: Wendy Sims, female    DOB: [birth year], [age] y.o.   MRN: [id number]  HPI Comments: [age] YO female with h/o  Diastolic CHF,  severe pulmonary hypertension , chronic atrial fibrillation, chronic hyponatremia, anemia, CKD (worse with diuresis) presents for follow up.  She has friends that present with her today. She continues to walk with a walker, overall has been relatively stable. She denies any significant lower extremity edema. She's not taking Lasix on a regular basis. She denies any recent falls, balance is poor Reports that she drinks with meals, not excessively  Not very active at baseline. No chest pain, dyspnea at rest.  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  During hospitalization, diuresed with IV lasix, however had worsening renal function . discharged home to skilled nursing.    Carvedilol was decreased down to 3.125 mg twice a day presumably for low blood pressure   Echocardiogram dated 11/25/2012 shows ejection fraction 50-55%, mild to moderate TR, right ventricular systolic pressure estimated at 75 mmHg  EKG today shows atrial fibrillation with ventricular rate 84 beats per minute    Outpatient Encounter Prescriptions as of 12/22/2013  Medication Sig  . acetaminophen (TYLENOL) 500 MG tablet Take 500 mg by mouth. Take 1 every morning, 1 in the evening as needed and 2 at night as needed  . aspirin 81 MG tablet Take 81 mg by mouth daily.  . carvedilol (COREG) 3.125 MG tablet Take 1 tablet (3.125 mg total) by mouth 2 (two) times daily with a meal.  . Fluticasone-Salmeterol (ADVAIR) 100-50 MCG/DOSE AEPB Inhale 1 puff into the lungs 2 (two) times daily. By Mouth every 12 hours. Rinse mouth after using  . furosemide (LASIX) 20 MG tablet Take 20 mg by mouth 2 (two) times daily as needed.  . loratadine (CLARITIN) 10 MG  tablet Take 10 mg by mouth daily.  . vitamin B-12 (CYANOCOBALAMIN) 1000 MCG tablet Take 1,000 mcg by mouth daily.  Marland Kitchen. zolpidem (AMBIEN) 5 MG tablet Take 0.5 tablets (2.5 mg total) by mouth at bedtime as needed for sleep.    Review of Systems  Constitutional: Negative.   HENT: Negative.   Eyes: Negative.   Respiratory: Positive for shortness of breath.   Cardiovascular: Positive for leg swelling.  Gastrointestinal: Negative.   Endocrine: Negative.   Musculoskeletal: Positive for gait problem.  Skin: Negative.   Allergic/Immunologic: Negative.   Neurological: Negative.   Hematological: Negative.   Psychiatric/Behavioral: Negative.   All other systems reviewed and are negative.   BP 120/82  Pulse 84  Ht 4\' 11"  (1.499 m)  Wt 104 lb (47.174 kg)  BMI 20.99 kg/m2  Physical Exam  Nursing note and vitals reviewed. Constitutional: She is oriented to person, place, and time. She appears well-developed and well-nourished.  HENT:  Head: Normocephalic.  Nose: Nose normal.  Mouth/Throat: Oropharynx is clear and moist.  Eyes: Conjunctivae are normal. Pupils are equal, round, and reactive to light.  Neck: Normal range of motion. Neck supple. No JVD present.  Cardiovascular: Normal rate, regular rhythm, S1 normal, S2 normal, normal heart sounds and intact distal pulses.  Exam reveals no gallop and no friction rub.   No murmur heard. Pulmonary/Chest: Effort normal and breath sounds normal. No respiratory distress. She has no wheezes. She has no rales. She exhibits no tenderness.  Abdominal: Soft. Bowel sounds are normal. She  exhibits no distension. There is no tenderness.  Musculoskeletal: Normal range of motion. She exhibits no edema and no tenderness.  Lymphadenopathy:    She has no cervical adenopathy.  Neurological: She is alert and oriented to person, place, and time. Coordination normal.  Skin: Skin is warm and dry. No rash noted. No erythema.  Psychiatric: She has a normal mood and  affect. Her behavior is normal. Judgment and thought content normal.    Assessment and Plan

## 2013-12-22 NOTE — Assessment & Plan Note (Signed)
No significant lower extremity edema. She denies having shortness of breath. Encouraged her to take Lasix as needed for shortness of breath or leg edema

## 2013-12-25 ENCOUNTER — Ambulatory Visit (INDEPENDENT_AMBULATORY_CARE_PROVIDER_SITE_OTHER): Payer: Medicare Other | Admitting: Adult Health

## 2013-12-25 ENCOUNTER — Encounter: Payer: Self-pay | Admitting: Adult Health

## 2013-12-25 VITALS — BP 146/82 | HR 81 | Temp 98.4°F | Resp 16 | Ht 60.0 in | Wt 104.8 lb

## 2013-12-25 DIAGNOSIS — L0390 Cellulitis, unspecified: Secondary | ICD-10-CM | POA: Insufficient documentation

## 2013-12-25 DIAGNOSIS — L0291 Cutaneous abscess, unspecified: Secondary | ICD-10-CM

## 2013-12-25 MED ORDER — CEPHALEXIN 500 MG PO CAPS: ORAL_CAPSULE | ORAL | Status: DC

## 2013-12-25 NOTE — Progress Notes (Signed)
   Subjective:    Patient ID: Wendy Sims, female    DOB: 10/29/1908, 80 y.o.   MRN: [id number]  HPI Pt is a pleasant 80 y/o female who presents to clinic with right outer lower extremity skin tear which has a scab over it. The surrounding area is red. No swelling. She is here for evaluation. No fever, chills. The area is painful.  Past Medical History  Diagnosis Date  . Asthma   . COPD (chronic obstructive pulmonary disease)   . Pulmonary fibrosis   . CHF (congestive heart failure)   . HTN (hypertension)   . Hyperlipidemia   . Osteoporosis   . Ischemic cardiomyopathy     w/low EJ of 20-25%  . Atrial fibrillation       Review of Systems  Constitutional: Negative.   HENT: Negative.   Eyes: Negative.   Respiratory: Negative.   Cardiovascular: Negative.   Gastrointestinal: Negative.   Endocrine: Negative.   Genitourinary: Negative.   Musculoskeletal: Negative.   Skin: Positive for wound (right lower extremity).  Allergic/Immunologic: Negative.   Neurological: Negative.   Hematological: Negative.   Psychiatric/Behavioral: Negative.        Objective:   Physical Exam  Constitutional: She is oriented to person, place, and time. No distress.  HENT:  Head: Normocephalic and atraumatic.  Eyes: Conjunctivae and EOM are normal.  Neck: Normal range of motion. Neck supple.  Cardiovascular: Normal rate and regular rhythm.   Pulmonary/Chest: Effort normal. No respiratory distress.  Musculoskeletal: She exhibits tenderness. She exhibits no edema.  Right lower extremity  Neurological: She is alert and oriented to person, place, and time. She has normal reflexes.  Skin: Skin is warm. There is erythema.  Area at previous skin tear is dry. There is eschar formation. Surrounding area is erythematous. No edema.  Psychiatric: She has a normal mood and affect. Her behavior is normal. Judgment and thought content normal.      Assessment & Plan:   1. Cellulitis Mild cellulitis  surround previous skin tear. Start keflex 500 mg every 12 hours for 7 days. Renally dosed for CrCl of 15.7 Keep area open to air. Elevate extremity. Keep clean and dry.

## 2013-12-25 NOTE — Progress Notes (Signed)
Pre visit review using our clinic review tool, if applicable. No additional management support is needed unless otherwise documented below in the visit note. 

## 2013-12-25 NOTE — Patient Instructions (Signed)
  Keep the area on her right lower leg open to air.  Keep it clean and dry. Avoid injury to the area.  Start keflex 500 mg every 12 hours for 7 days.  This was dosed for her current kidney function.  Call if any questions or concerns.

## 2013-12-26 IMAGING — CR DG CHEST 2V
1 series · 2 of 2 positions shown · non-contrast
Comparison: none

REASON FOR EXAM: fever
COMMENTS:   LMP: Post-Menopausal

PROCEDURE:     DXR - DXR CHEST PA (OR AP) AND LATERAL  - March 30, 2013 [DATE]
RESULT:     Comparison: 02/23/2013

[Series 1: w chest pa · 0.14mm/px · 2 of 2 slices shown]
[im 1/2]
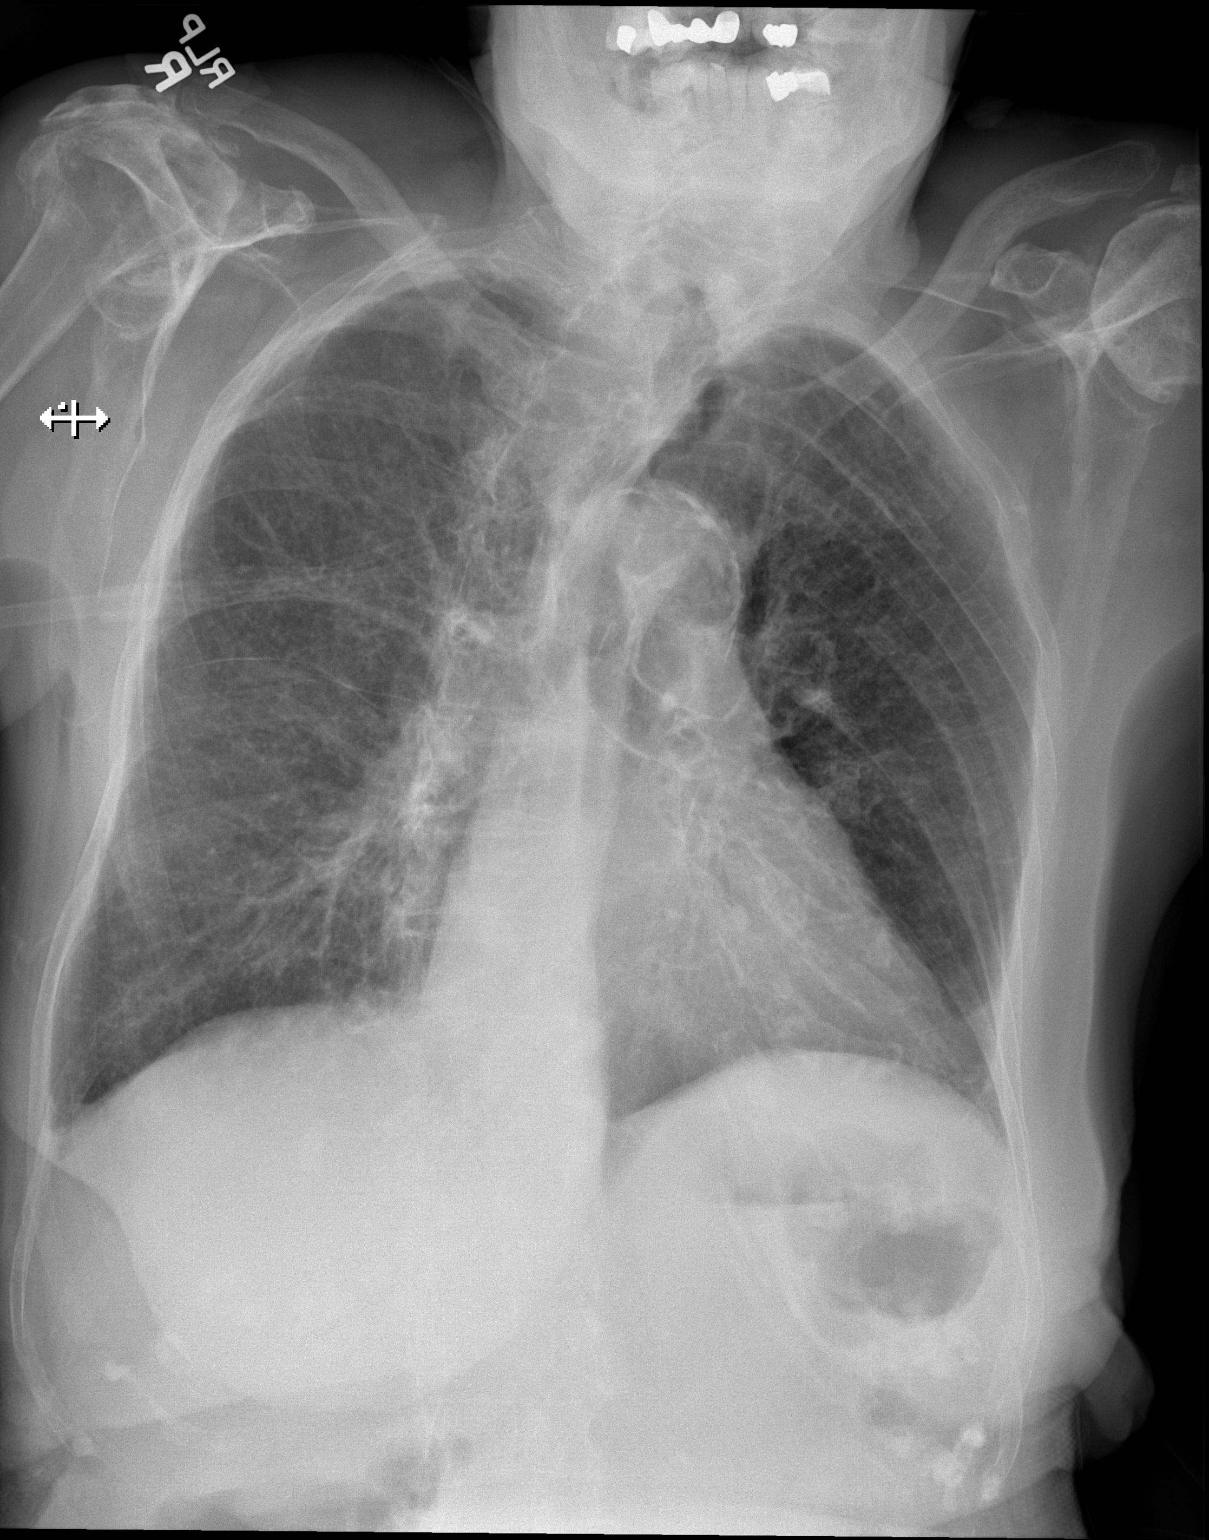
[im 2/2]
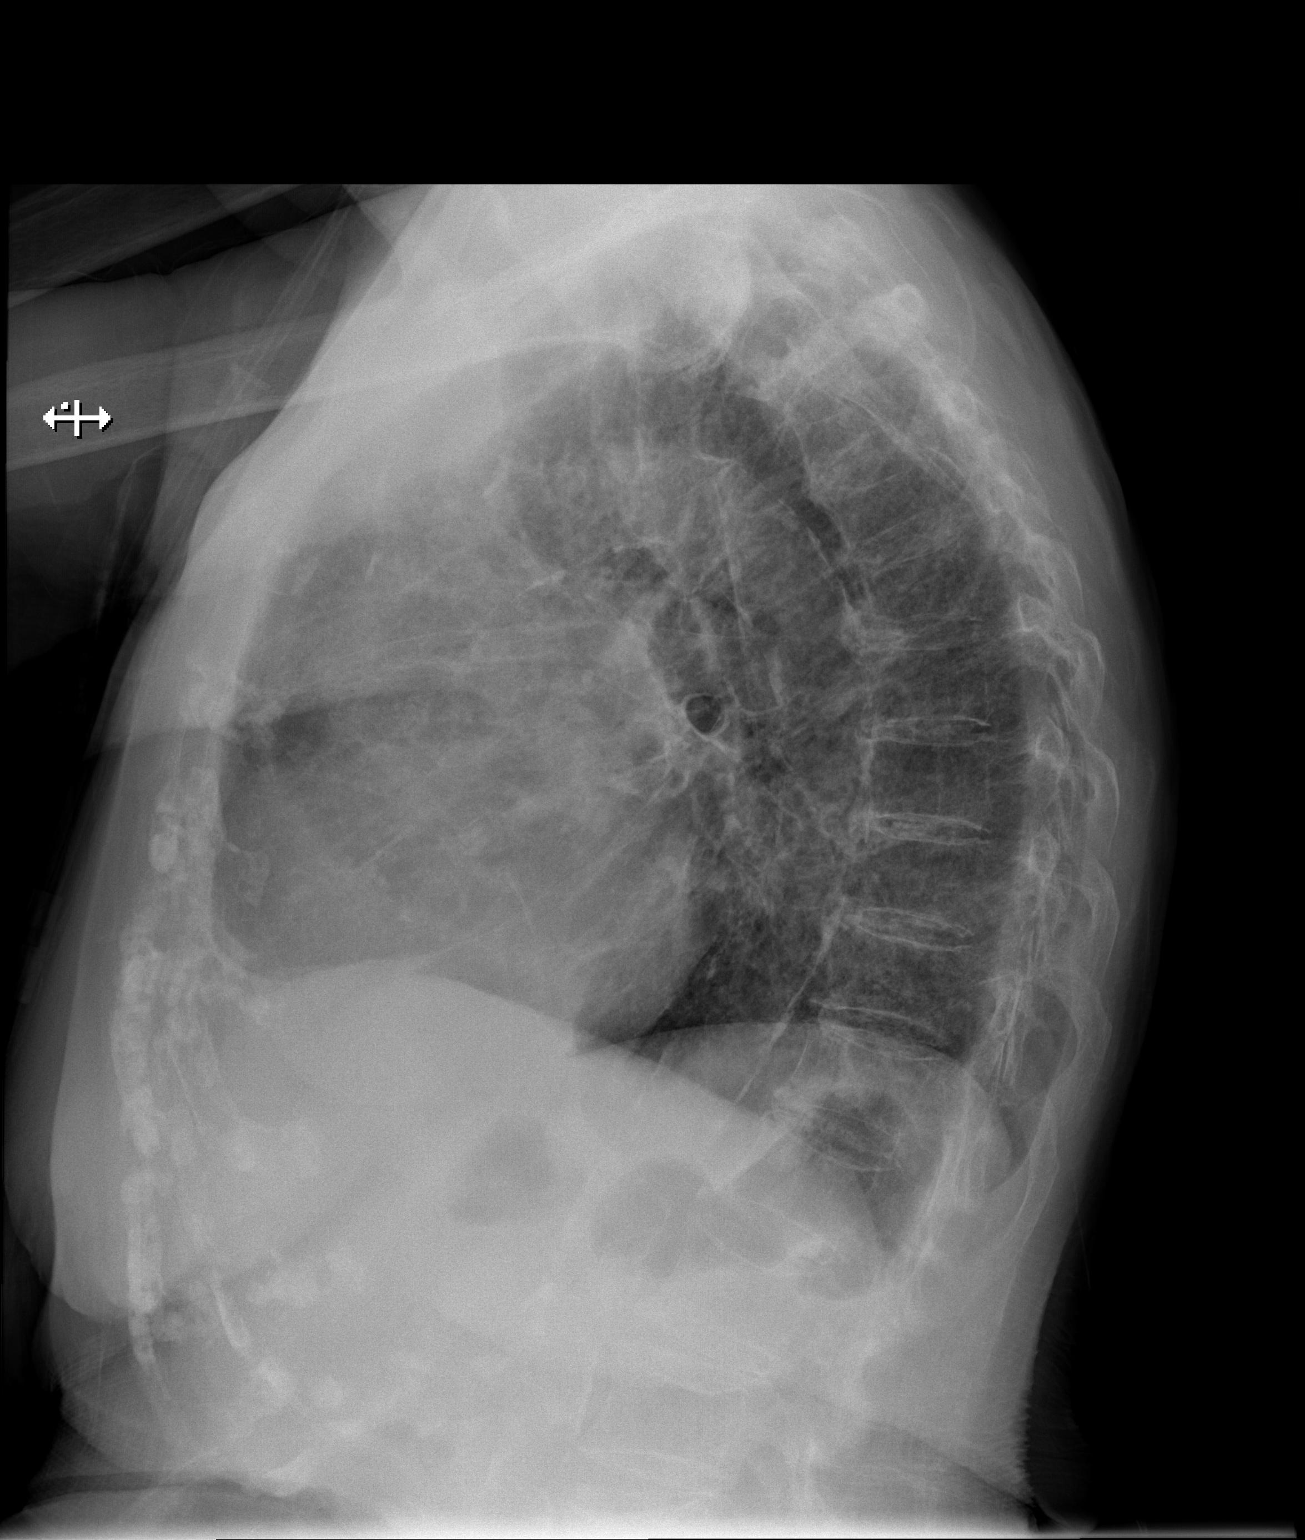

[2 of 2 positions shown; findings below may reference images not displayed]

FINDINGS: PA and lateral chest radiographs are provided. There is bilateral diffuse
interstitial thickening likely representing chronic interstitial disease,
but superimposed mild interstitial edema versus interstitial pneumonitis
secondary to an infectious or inflammatory etiology cannot be excluded.
There is no focal parenchymal opacity, pleural effusion, or pneumothorax.
The heart and mediastinum are unremarkable.  The osseous structures are
unremarkable.
IMPRESSION: Bilateral diffuse interstitial thickening likely representing chronic
interstitial disease, but superimposed mild interstitial edema versus
interstitial pneumonitis secondary to an infectious or inflammatory etiology
cannot be excluded.

[REDACTED]

## 2014-02-01 ENCOUNTER — Telehealth: Payer: Self-pay | Admitting: *Deleted

## 2014-02-01 NOTE — Telephone Encounter (Signed)
Sp[facility] with [place] [facility].  She states she has attempted numerous times to [facility] for [facility] on wound care of pts inner lower right leg.  Also requesting a topical medication to be applied that the pt is not allergic to.  She further states she has an order signed by Dr Lorin PicketScott on 6.24.15 to hold on topical cream is no signs of infection.  Also states there is swelling, redness and tender to touch.  This is not the same skin tear as last month.  Please advise

## 2014-02-01 NOTE — Telephone Encounter (Signed)
Needs to be seen in clinic and evaluated. May use 3:45pm slot tomorrow.

## 2014-02-01 NOTE — Telephone Encounter (Signed)
[place] at [facility] aware of appointment

## 2014-02-02 ENCOUNTER — Ambulatory Visit (INDEPENDENT_AMBULATORY_CARE_PROVIDER_SITE_OTHER): Payer: Medicare Other | Admitting: Internal Medicine

## 2014-02-02 ENCOUNTER — Ambulatory Visit: Payer: Medicare Other | Admitting: Adult Health

## 2014-02-02 ENCOUNTER — Encounter: Payer: Self-pay | Admitting: Internal Medicine

## 2014-02-02 VITALS — BP 130/72 | HR 90 | Temp 98.2°F | Ht 60.0 in | Wt 106.5 lb

## 2014-02-02 DIAGNOSIS — I10 Essential (primary) hypertension: Secondary | ICD-10-CM

## 2014-02-02 DIAGNOSIS — N183 Chronic kidney disease, stage 3 unspecified: Secondary | ICD-10-CM

## 2014-02-02 DIAGNOSIS — S81009A Unspecified open wound, unspecified knee, initial encounter: Secondary | ICD-10-CM

## 2014-02-02 DIAGNOSIS — S91009A Unspecified open wound, unspecified ankle, initial encounter: Secondary | ICD-10-CM

## 2014-02-02 DIAGNOSIS — S81802A Unspecified open wound, left lower leg, initial encounter: Secondary | ICD-10-CM

## 2014-02-02 DIAGNOSIS — S81809A Unspecified open wound, unspecified lower leg, initial encounter: Secondary | ICD-10-CM

## 2014-02-02 MED ORDER — GENTAMICIN SULFATE 0.1 % EX OINT: 1.0000 "application " | TOPICAL_OINTMENT | Freq: Two times a day (BID) | CUTANEOUS | Status: DC

## 2014-02-02 NOTE — Assessment & Plan Note (Signed)
Lab Results  Component Value Date   CREATININE 1.3* 11/05/2013   Recent kidney function in 11/2013 was stable. Will plan to repeat labs in 04/2014.

## 2014-02-02 NOTE — Progress Notes (Signed)
Pre visit review using our clinic review tool, if applicable. No additional management support is needed unless otherwise documented below in the visit note. 

## 2014-02-02 NOTE — Assessment & Plan Note (Signed)
BP Readings from Last 3 Encounters:  02/02/14 130/72  12/25/13 146/82  12/22/13 120/82   BP well controlled on current medications.

## 2014-02-02 NOTE — Assessment & Plan Note (Signed)
Wound appears to be healing well. Will start Gentamicin ointment bid prn to help prevent any secondary infection. Wound will be covered with dry gauze or Telfa.

## 2014-02-02 NOTE — Patient Instructions (Signed)
Start Gentamicin ointment twice daily to left lower leg wound.  Cover wound with Telfa island or dry gauze. Change dressing daily.  Follow up as needed.

## 2014-02-02 NOTE — Progress Notes (Signed)
Subjective:    Patient ID: Wendy Sims, female    DOB: 06-Sep-1908, [age] y.o.   MRN: [id number]  HPI [age] YO female presents for acute visit.  Generally doing well. Has frequent skin tears on her lower legs bilaterally and would like to start topical antibiotic ointment to skin tear on left lower leg. Allergic to Neomycin.  Otherwise, no concerns. Appetite good. Continues to walk daily. No recent falls. Would prefer to limit doctors visits as much as possible.  Review of Systems  Constitutional: Negative for fever, chills, appetite change, fatigue and unexpected weight change.  Eyes: Negative for visual disturbance.  Respiratory: Negative for shortness of breath.   Cardiovascular: Negative for chest pain and leg swelling.  Gastrointestinal: Negative for abdominal pain.  Musculoskeletal: Positive for gait problem (uses Keyshia Orwick). Negative for arthralgias and back pain.  Skin: Positive for color change and wound. Negative for rash.  Hematological: Negative for adenopathy. Does not bruise/bleed easily.  Psychiatric/Behavioral: Negative for dysphoric mood. The patient is not nervous/anxious.        Objective:    BP 130/72  Pulse 90  Temp(Src) 98.2 F (36.8 C) (Oral)  Ht 5' (1.524 m)  Wt 106 lb 8 oz (48.308 kg)  BMI 20.80 kg/m2  SpO2 94% Physical Exam  Constitutional: She is oriented to person, place, and time. She appears well-developed and well-nourished. No distress.  HENT:  Head: Normocephalic and atraumatic.  Right Ear: External ear normal.  Left Ear: External ear normal.  Nose: Nose normal.  Mouth/Throat: Oropharynx is clear and moist. No oropharyngeal exudate.  Eyes: Conjunctivae are normal. Pupils are equal, round, and reactive to light. Right eye exhibits no discharge. Left eye exhibits no discharge. No scleral icterus.  Neck: Normal range of motion. Neck supple. No tracheal deviation present. No thyromegaly present.  Cardiovascular: Normal rate, regular rhythm and  intact distal pulses.  Exam reveals no gallop and no friction rub.   Murmur heard. Pulmonary/Chest: Effort normal and breath sounds normal. No accessory muscle usage. Not tachypneic. No respiratory distress. She has no decreased breath sounds. She has no wheezes. She has no rhonchi. She has no rales. She exhibits no tenderness.  Musculoskeletal: Normal range of motion. She exhibits no edema and no tenderness.  Lymphadenopathy:    She has no cervical adenopathy.  Neurological: She is alert and oriented to person, place, and time. No cranial nerve deficit. She exhibits normal muscle tone. Coordination normal.  Skin: Skin is warm and dry. Ecchymosis noted. No rash noted. She is not diaphoretic. No erythema. No pallor.     Psychiatric: She has a normal mood and affect. Her behavior is normal. Judgment and thought content normal.          Assessment & Plan:  Over 25min of which >50% spent in face-to-face contact with patient discussing plan of care  Problem List Items Addressed This Visit     Unprioritized   Chronic kidney disease (CKD), stage III (moderate)      Lab Results  Component Value Date   CREATININE 1.3* 11/05/2013   Recent kidney function in 11/2013 was stable. Will plan to repeat labs in 04/2014.    Hypertension      BP Readings from Last 3 Encounters:  02/02/14 130/72  12/25/13 146/82  12/22/13 120/82   BP well controlled on current medications.    Open wound of left lower leg - Primary     Wound appears to be healing well. Will start Gentamicin ointment bid prn  to help prevent any secondary infection. Wound will be covered with dry gauze or Telfa.    Relevant Medications      gentamicin (GARAMYCIN) ointment 0.1%       Return in about 3 months (around 05/05/2014).

## 2014-02-12 ENCOUNTER — Ambulatory Visit: Payer: Medicare Other | Admitting: Internal Medicine

## 2014-02-15 ENCOUNTER — Encounter: Payer: Self-pay | Admitting: Adult Health

## 2014-02-15 ENCOUNTER — Ambulatory Visit (INDEPENDENT_AMBULATORY_CARE_PROVIDER_SITE_OTHER): Payer: Medicare Other | Admitting: Adult Health

## 2014-02-15 ENCOUNTER — Ambulatory Visit (INDEPENDENT_AMBULATORY_CARE_PROVIDER_SITE_OTHER)
Admission: RE | Admit: 2014-02-15 | Discharge: 2014-02-15 | Disposition: A | Discharge status: Home / Self Care | Payer: Medicare Other | Source: Ambulatory Visit | Attending: Adult Health | Admitting: Adult Health

## 2014-02-15 VITALS — BP 166/100 | HR 102 | Temp 98.2°F | Resp 14 | Ht 60.0 in | Wt 106.5 lb

## 2014-02-15 DIAGNOSIS — R0602 Shortness of breath: Secondary | ICD-10-CM

## 2014-02-15 LAB — BASIC METABOLIC PANEL
BUN: 33 mg/dL — AB (ref 6–23)
CO2: 24 meq/L (ref 19–32)
CREATININE: 1.1 mg/dL (ref 0.4–1.2)
Calcium: 9.3 mg/dL (ref 8.4–10.5)
Chloride: 100 mEq/L (ref 96–112)
GFR: 46.75 mL/min — ABNORMAL LOW (ref >60.00)
Glucose, Bld: 100 mg/dL — ABNORMAL HIGH (ref 70–99)
Potassium: 5.1 mEq/L (ref 3.5–5.1)
Sodium: 132 mEq/L — ABNORMAL LOW (ref 135–145)

## 2014-02-15 MED ORDER — AMOXICILLIN-POT CLAVULANATE 875-125 MG PO TABS: 1.0000 | ORAL_TABLET | Freq: Two times a day (BID) | ORAL | Status: DC

## 2014-02-15 NOTE — Progress Notes (Signed)
Pre visit review using our clinic review tool, if applicable. No additional management support is needed unless otherwise documented below in the visit note. 

## 2014-02-15 NOTE — Progress Notes (Signed)
Patient ID: Brett Albinossie Charon, female   DOB: 1909-01-06, 80 y.o.   MRN: [id number]   Subjective:    Patient ID: Brett AlbinoEssie Gargis, female    DOB: 1909-01-06, 80 y.o.   MRN: [id number]  HPI  This a very pleasant 80 y/o female who presents to clinic with shortness of breath worse than usual. Hx of COPD, pulmonary fibrosis, ischemic cardiomyopathy with EF ~20%. She reports that she is always short of breath but her symptoms worsened approximately 2 days ago. She has a cough. Denies fever. She also has some swelling of her feet and ankles. Has prn lasix bid which she takes every morning. Usually does not take a 2nd dose unless the nurses at the facility notice edema. She presents to clinic with her daughter.   Past Medical History  Diagnosis Date  . Asthma   . COPD (chronic obstructive pulmonary disease)   . Pulmonary fibrosis   . CHF (congestive heart failure)   . HTN (hypertension)   . Hyperlipidemia   . Osteoporosis   . Ischemic cardiomyopathy     w/low EJ of 20-25%  . Atrial fibrillation     Current Outpatient Prescriptions on File Prior to Visit  Medication Sig Dispense Refill  . acetaminophen (TYLENOL) 500 MG tablet Take 500 mg by mouth. Take 1 every morning, 1 in the evening as needed and 2 at night as needed      . aspirin 81 MG tablet Take 81 mg by mouth daily.      . carvedilol (COREG) 3.125 MG tablet Take 1 tablet (3.125 mg total) by mouth 2 (two) times daily with a meal.  180 tablet  3  . Fluticasone-Salmeterol (ADVAIR) 100-50 MCG/DOSE AEPB Inhale 1 puff into the lungs 2 (two) times daily. By Mouth every 12 hours. Rinse mouth after using      . furosemide (LASIX) 20 MG tablet Take 20 mg by mouth 2 (two) times daily as needed.      Marland Kitchen. gentamicin ointment (GARAMYCIN) 0.1 % Apply 1 application topically 2 (two) times daily.  30 g  3  . vitamin B-12 (CYANOCOBALAMIN) 1000 MCG tablet Take 1,000 mcg by mouth daily.      Marland Kitchen. zolpidem (AMBIEN) 5 MG tablet Take 0.5 tablets (2.5 mg total) by  mouth at bedtime as needed for sleep.  30 tablet  3   No current facility-administered medications on file prior to visit.    Review of Systems  Constitutional: Positive for fatigue. Negative for fever and chills.  HENT: Positive for congestion, postnasal drip and rhinorrhea.   Respiratory: Positive for cough and shortness of breath. Negative for wheezing.   Cardiovascular: Positive for leg swelling. Negative for chest pain.  Neurological: Negative.   Psychiatric/Behavioral: Negative.   All other systems reviewed and are negative.      Objective:  BP 166/100  Pulse 102  Temp(Src) 98.2 F (36.8 C) (Oral)  Resp 14  Ht 5' (1.524 m)  Wt 106 lb 8 oz (48.308 kg)  BMI 20.80 kg/m2  SpO2 94%   Physical Exam  Constitutional: She is oriented to person, place, and time.  Use of accessory muscles to aid respiratory effort which worsens with exertion.  Cardiovascular: Regular rhythm.   tachycardia  Pulmonary/Chest: She is in respiratory distress (shortness of breath). She has no wheezes. She has no rales.  Neurological: She is alert and oriented to person, place, and time.  Psychiatric: She has a normal mood and affect. Her behavior is normal. Judgment and thought  content normal.      Assessment & Plan:   1. Shortness of breath Respiratory infection vs. Exacerbation of HF. She is tachycardic with regular rhythm but suspect it is due to her respiratory effort. I will check labs. I am ordering empiric augmentin. Sending her for chest xray at [facility] to r/o fluid vs. Infiltrates. I will ask the facility ([facility]) to administer lasix bid for the next 3 days. She will return for f/u on Thursday. - Basic metabolic panel - B Nat Peptide - DG Chest 2 View; Future

## 2014-02-16 LAB — BRAIN NATRIURETIC PEPTIDE: Pro B Natriuretic peptide (BNP): 323 pg/mL — ABNORMAL HIGH (ref 0.0–100.0)

## 2014-02-19 ENCOUNTER — Encounter: Payer: Self-pay | Admitting: Internal Medicine

## 2014-02-19 ENCOUNTER — Ambulatory Visit (INDEPENDENT_AMBULATORY_CARE_PROVIDER_SITE_OTHER): Payer: Medicare Other | Admitting: Internal Medicine

## 2014-02-19 VITALS — BP 120/66 | HR 87 | Temp 96.6°F | Ht 60.0 in | Wt 107.0 lb

## 2014-02-19 DIAGNOSIS — J209 Acute bronchitis, unspecified: Secondary | ICD-10-CM

## 2014-02-19 MED ORDER — CETIRIZINE HCL 10 MG PO TABS: 10.0000 mg | ORAL_TABLET | Freq: Every day | ORAL | Status: DC

## 2014-02-19 NOTE — Progress Notes (Signed)
Pre visit review using our clinic review tool, if applicable. No additional management support is needed unless otherwise documented below in the visit note. 

## 2014-02-19 NOTE — Patient Instructions (Addendum)
Take Lasix 20mg  daily.  Stop Augmentin.  Start Cetirizine 10mg  daily.  Follow up in 3 months and sooner as needed.

## 2014-02-19 NOTE — Assessment & Plan Note (Signed)
Recent episode most consistent with acute bronchitis. Symptoms have improved. CXR was normal. Will stop Augmentin. Continue to monitor. Pt daughter will email with update next week.

## 2014-02-19 NOTE — Progress Notes (Signed)
   Subjective:    Patient ID: Wendy Sims, female    DOB: 12-19-1908, [age] y.o.   MRN: [id number]  HPI 80YO female presents for follow up.  Pt and her daughter feel that symptoms of dyspnea and cough are improving. No fever, chills. CXR was normal. Pt does not want any further intervention. States she has lived too long and is ready to die.  Review of Systems  Constitutional: Positive for fatigue. Negative for fever, chills and unexpected weight change.  HENT: Positive for hearing loss. Negative for congestion, postnasal drip and rhinorrhea.   Eyes: Negative for pain, discharge, redness and visual disturbance.  Respiratory: Positive for cough and shortness of breath. Negative for chest tightness, wheezing and stridor.   Cardiovascular: Negative for chest pain, palpitations and leg swelling.  Gastrointestinal: Negative for diarrhea and constipation.  Musculoskeletal: Positive for arthralgias and myalgias. Negative for neck pain and neck stiffness.  Skin: Negative for color change and rash.  Neurological: Negative for dizziness, weakness, light-headedness and headaches.  Hematological: Negative for adenopathy.       Objective:    BP 120/66  Pulse 87  Temp(Src) 96.6 F (35.9 C) (Axillary)  Ht 5' (1.524 m)  Wt 107 lb (48.535 kg)  BMI 20.90 kg/m2  SpO2 94% RR 15 Physical Exam  Constitutional: She is oriented to person, place, and time. She appears well-developed and well-nourished. No distress.  HENT:  Head: Normocephalic and atraumatic.  Right Ear: External ear normal.  Left Ear: External ear normal.  Nose: Nose normal.  Mouth/Throat: Oropharynx is clear and moist. No oropharyngeal exudate.  Eyes: Conjunctivae are normal. Pupils are equal, round, and reactive to light. Right eye exhibits no discharge. Left eye exhibits no discharge. No scleral icterus.  Neck: Normal range of motion. Neck supple. No tracheal deviation present. No thyromegaly present.  Cardiovascular: Normal  rate, regular rhythm, normal heart sounds and intact distal pulses.  Exam reveals no gallop and no friction rub.   No murmur heard. Pulmonary/Chest: Accessory muscle usage present. Tachypnea noted. No respiratory distress. She has no decreased breath sounds. She has no wheezes. She has rhonchi (occasional scattered). She has no rales. She exhibits no tenderness.  Musculoskeletal: Normal range of motion. She exhibits no edema and no tenderness.  Lymphadenopathy:    She has no cervical adenopathy.  Neurological: She is alert and oriented to person, place, and time. No cranial nerve deficit. She exhibits normal muscle tone. Coordination normal.  Skin: Skin is warm and dry. No rash noted. She is not diaphoretic. No erythema. No pallor.  Psychiatric: She has a normal mood and affect. Her behavior is normal. Judgment and thought content normal.          Assessment & Plan:   Problem List Items Addressed This Visit     Unprioritized   Acute bronchitis - Primary     Recent episode most consistent with acute bronchitis. Symptoms have improved. CXR was normal. Will stop Augmentin. Continue to monitor. Pt daughter will email with update next week.        Return in about 3 months (around 05/22/2014) for Recheck.

## 2014-02-25 ENCOUNTER — Ambulatory Visit (INDEPENDENT_AMBULATORY_CARE_PROVIDER_SITE_OTHER): Payer: Medicare Other | Admitting: Podiatrist

## 2014-02-25 ENCOUNTER — Encounter: Payer: Self-pay | Admitting: Podiatrist

## 2014-02-25 VITALS — BP 144/98 | HR 102 | Resp 20 | Ht 60.0 in

## 2014-02-25 DIAGNOSIS — M79676 Pain in unspecified toe(s): Principal | ICD-10-CM

## 2014-02-25 DIAGNOSIS — B351 Tinea unguium: Secondary | ICD-10-CM

## 2014-02-25 DIAGNOSIS — I739 Peripheral vascular disease, unspecified: Secondary | ICD-10-CM

## 2014-02-25 DIAGNOSIS — M79609 Pain in unspecified limb: Secondary | ICD-10-CM

## 2014-02-25 NOTE — Progress Notes (Signed)
HPI:  Patient presents today for follow up of foot and nail care. Relates pain on bilateral great toenails.  Objective:  Patients chart is reviewed.  Vascular status reveals pedal pulses noted at 1 out of 4 dp and 0/4 pt bilateral .  Neurological sensation is Normal to [facility] monofilament bilateral.  Patients nails are thickened, discolored, distrophic, friable and brittle with yellow-brown discoloration. Patient subjectively relates they are painful with shoes and with ambulation of bilateral feet. She relates bilateral great toenails are very sore and wants them cut as short as possible.  Assessment:  Symptomatic onychomycosis  Plan:  Discussed treatment options and alternatives.  The symptomatic toenails were debrided through manual an mechanical means without complication. I tried to cut her great toenails a short as possible and she related there were still uncomfortable. She is not a candidate to have the toenails removed and therefore discussed trying to soak in Epsom salts soaks to soften the nails. Return appointment recommended at routine intervals of 3 months

## 2014-03-15 ENCOUNTER — Emergency Department: Payer: Self-pay | Admitting: Emergency Medicine

## 2014-03-16 ENCOUNTER — Other Ambulatory Visit: Payer: Self-pay | Admitting: *Deleted

## 2014-03-16 DIAGNOSIS — G4700 Insomnia, unspecified: Secondary | ICD-10-CM

## 2014-03-16 MED ORDER — ZOLPIDEM TARTRATE 5 MG PO TABS: 2.5000 mg | ORAL_TABLET | Freq: Every evening | ORAL | Status: DC | PRN

## 2014-03-16 MED ORDER — FLEET ENEMA 7-19 GM/118ML RE ENEM: 1.0000 | ENEMA | Freq: Every day | RECTAL | Status: AC | PRN

## 2014-03-16 NOTE — Telephone Encounter (Signed)
[facility], needing hardscript for Zolpidem and Fleet enema (pt was taken to [facility] yesterday for constipation and facility does not have standing order). Pt's daughter in office to pick up Rxs.

## 2014-03-22 ENCOUNTER — Other Ambulatory Visit: Payer: Self-pay | Admitting: Internal Medicine

## 2014-03-25 ENCOUNTER — Other Ambulatory Visit: Payer: Self-pay | Admitting: *Deleted

## 2014-03-25 ENCOUNTER — Other Ambulatory Visit: Payer: Self-pay | Admitting: Cardiovascular Disease

## 2014-03-25 MED ORDER — CARVEDILOL 3.125 MG PO TABS: 3.1250 mg | ORAL_TABLET | Freq: Two times a day (BID) | ORAL | Status: DC

## 2014-04-19 ENCOUNTER — Other Ambulatory Visit: Payer: Self-pay | Admitting: Internal Medicine

## 2014-04-19 DIAGNOSIS — G4700 Insomnia, unspecified: Secondary | ICD-10-CM

## 2014-04-19 MED ORDER — ZOLPIDEM TARTRATE 5 MG PO TABS: 2.5000 mg | ORAL_TABLET | Freq: Every evening | ORAL | Status: DC | PRN

## 2014-05-03 ENCOUNTER — Ambulatory Visit (INDEPENDENT_AMBULATORY_CARE_PROVIDER_SITE_OTHER): Payer: Medicare Other | Admitting: Internal Medicine

## 2014-05-03 ENCOUNTER — Ambulatory Visit: Payer: Medicare Other | Admitting: Internal Medicine

## 2014-05-03 ENCOUNTER — Encounter: Payer: Self-pay | Admitting: Internal Medicine

## 2014-05-03 VITALS — BP 100/60 | HR 81 | Temp 97.6°F | Ht 60.0 in | Wt 104.5 lb

## 2014-05-03 DIAGNOSIS — D509 Iron deficiency anemia, unspecified: Secondary | ICD-10-CM

## 2014-05-03 DIAGNOSIS — N183 Chronic kidney disease, stage 3 unspecified: Secondary | ICD-10-CM

## 2014-05-03 DIAGNOSIS — I10 Essential (primary) hypertension: Secondary | ICD-10-CM

## 2014-05-03 DIAGNOSIS — I509 Heart failure, unspecified: Secondary | ICD-10-CM

## 2014-05-03 DIAGNOSIS — Z23 Encounter for immunization: Secondary | ICD-10-CM

## 2014-05-03 LAB — CBC WITH DIFFERENTIAL/PLATELET
BASOS ABS: 0 10*3/uL (ref 0.0–0.1)
Basophils Relative: 0.5 % (ref 0.0–3.0)
EOS PCT: 10.9 % — AB (ref 0.0–5.0)
Eosinophils Absolute: 0.9 10*3/uL — ABNORMAL HIGH (ref 0.0–0.7)
HCT: 36.2 % (ref 36.0–46.0)
Hemoglobin: 11.9 g/dL — ABNORMAL LOW (ref 12.0–15.0)
LYMPHS PCT: 26.6 % (ref 12.0–46.0)
Lymphs Abs: 2.2 10*3/uL (ref 0.7–4.0)
MCHC: 33 g/dL (ref 30.0–36.0)
MCV: 88.1 fl (ref 78.0–100.0)
MONOS PCT: 7.2 % (ref 3.0–12.0)
Monocytes Absolute: 0.6 10*3/uL (ref 0.1–1.0)
NEUTROS ABS: 4.5 10*3/uL (ref 1.4–7.7)
Neutrophils Relative %: 54.8 % (ref 43.0–77.0)
Platelets: 182 10*3/uL (ref 150.0–400.0)
RBC: 4.11 Mil/uL (ref 3.87–5.11)
RDW: 14.2 % (ref 11.5–15.5)
WBC: 8.1 10*3/uL (ref 4.0–10.5)

## 2014-05-03 LAB — COMPREHENSIVE METABOLIC PANEL
ALT: 9 U/L (ref 0–35)
AST: 20 U/L (ref 0–37)
Albumin: 4.2 g/dL (ref 3.5–5.2)
Alkaline Phosphatase: 60 U/L (ref 39–117)
BUN: 27 mg/dL — ABNORMAL HIGH (ref 6–23)
CALCIUM: 9.3 mg/dL (ref 8.4–10.5)
CHLORIDE: 96 meq/L (ref 96–112)
CO2: 26 meq/L (ref 19–32)
Creatinine, Ser: 1.2 mg/dL (ref 0.4–1.2)
GFR: 44.89 mL/min — AB (ref >60.00)
Glucose, Bld: 79 mg/dL (ref 70–99)
Potassium: 4.9 mEq/L (ref 3.5–5.1)
Sodium: 130 mEq/L — ABNORMAL LOW (ref 135–145)
Total Bilirubin: 0.3 mg/dL (ref 0.2–1.2)
Total Protein: 7.6 g/dL (ref 6.0–8.3)

## 2014-05-03 NOTE — Assessment & Plan Note (Signed)
Previous mild anemia. Will recheck CBC with labs.

## 2014-05-03 NOTE — Progress Notes (Signed)
Pre visit review using our clinic review tool, if applicable. No additional management support is needed unless otherwise documented below in the visit note. 

## 2014-05-03 NOTE — Assessment & Plan Note (Signed)
Renal function with labs today.  

## 2014-05-03 NOTE — Assessment & Plan Note (Signed)
BP Readings from Last 3 Encounters:  05/03/14 100/60  02/25/14 144/98  02/19/14 120/66   BP well controlled. Renal function with labs.

## 2014-05-03 NOTE — Patient Instructions (Signed)
Labs today.  Flu shot today. 

## 2014-05-03 NOTE — Assessment & Plan Note (Signed)
Appears euvolemic on exam today. Continue current medications. Renal function with labs.

## 2014-05-03 NOTE — Progress Notes (Signed)
Subjective:    Patient ID: Wendy Sims, female    DOB: [birth year], 80 y.o.   MRN: [id number]  HPI 325QD female presents for follow up. Generally feeling well. Has some chronic arthritis pain in her hands. Feels tired at times. Appetite is okay. No recent issues with bowel or bladder. No new concerns today. Her daughter is with her and has no concerns.  Review of Systems  Constitutional: Positive for fatigue. Negative for fever, chills, appetite change and unexpected weight change.  Eyes: Negative for visual disturbance.  Respiratory: Negative for shortness of breath.   Cardiovascular: Negative for chest pain and leg swelling.  Gastrointestinal: Negative for nausea, abdominal pain, diarrhea and constipation.  Musculoskeletal: Positive for arthralgias, joint swelling and myalgias. Negative for gait problem.  Skin: Negative for color change and rash.  Neurological: Negative for weakness.  Hematological: Negative for adenopathy. Does not bruise/bleed easily.  Psychiatric/Behavioral: Negative for dysphoric mood. The patient is not nervous/anxious.        Objective:    BP 100/60  Pulse 81  Temp(Src) 97.6 F (36.4 C) (Oral)  Ht 5' (1.524 m)  Wt 104 lb 8 oz (47.401 kg)  BMI 20.41 kg/m2  SpO2 95% Physical Exam  Constitutional: She is oriented to person, place, and time. She appears well-developed and well-nourished. No distress.  HENT:  Head: Normocephalic and atraumatic.  Right Ear: External ear normal.  Left Ear: External ear normal.  Nose: Nose normal.  Mouth/Throat: Oropharynx is clear and moist. No oropharyngeal exudate.  Eyes: Conjunctivae are normal. Pupils are equal, round, and reactive to light. Right eye exhibits no discharge. Left eye exhibits no discharge. No scleral icterus.  Neck: Normal range of motion. Neck supple. No tracheal deviation present. No thyromegaly present.  Cardiovascular: Normal rate, regular rhythm, normal heart sounds and intact distal pulses.   Exam reveals no gallop and no friction rub.   No murmur heard. Pulmonary/Chest: Effort normal and breath sounds normal. No accessory muscle usage. Not tachypneic. No respiratory distress. She has no decreased breath sounds. She has no wheezes. She has no rhonchi. She has no rales. She exhibits no tenderness.  Musculoskeletal: She exhibits no edema and no tenderness.       Right hand: She exhibits decreased range of motion and deformity (diffuse arthritis changes).       Left hand: She exhibits decreased range of motion and deformity (diffuse arthritic changes).  Lymphadenopathy:    She has no cervical adenopathy.  Neurological: She is alert and oriented to person, place, and time. No cranial nerve deficit. She exhibits normal muscle tone. Coordination normal.  Skin: Skin is warm and dry. No rash noted. She is not diaphoretic. No erythema. No pallor.  Psychiatric: She has a normal mood and affect. Her behavior is normal. Judgment and thought content normal.          Assessment & Plan:   Problem List Items Addressed This Visit     Unprioritized   Anemia     Previous mild anemia. Will recheck CBC with labs.    Relevant Orders      CBC w/Diff   [facility]     Appears euvolemic on exam today. Continue current medications. Renal function with labs.    Chronic kidney disease (CKD), stage III (moderate)     Renal function with labs today.    Hypertension - Primary      BP Readings from Last 3 Encounters:  05/03/14 100/60  02/25/14 144/98  02/19/14  120/66   BP well controlled. Renal function with labs.     Relevant Orders      Comp Met (CMET)    Other Visit Diagnoses   Need for prophylactic vaccination and inoculation against influenza            Return in about 3 months (around 08/02/2014) for [place].

## 2014-05-24 ENCOUNTER — Other Ambulatory Visit: Payer: Self-pay | Admitting: *Deleted

## 2014-05-24 DIAGNOSIS — G4700 Insomnia, unspecified: Secondary | ICD-10-CM

## 2014-05-24 MED ORDER — ZOLPIDEM TARTRATE 5 MG PO TABS: 2.5000 mg | ORAL_TABLET | Freq: Every evening | ORAL | Status: DC | PRN

## 2014-06-14 ENCOUNTER — Telehealth: Payer: Self-pay | Admitting: Internal Medicine

## 2014-06-14 NOTE — Telephone Encounter (Signed)
Wendy Sims from [facility] called on behalf of Ms. Wendy Sims. She's wondering if Dr. Dan HumphreysWalker can go ahead and send the paperwork to discontinue the pt's use of Advair. She said the pt's POA is well aware of this and they're just waiting on the paperwork (discontinue order). Please call the facility. Ph# [phone]   /Fax # [phone] Thank you.

## 2014-06-14 NOTE — Telephone Encounter (Signed)
I have not received any paperwork. Fine to discontinue Advair.

## 2014-06-14 NOTE — Telephone Encounter (Signed)
Faxed

## 2014-06-14 NOTE — Telephone Encounter (Signed)
Have you received some sort of paperwork regarding this? Or do I just need to send a letter?

## 2014-06-14 NOTE — Telephone Encounter (Signed)
Printed, waiting on letter to be signed

## 2014-06-21 ENCOUNTER — Encounter: Payer: Self-pay | Admitting: *Deleted

## 2014-06-21 ENCOUNTER — Telehealth: Payer: Self-pay | Admitting: *Deleted

## 2014-06-21 NOTE — Telephone Encounter (Signed)
Letter written and faxed as requested.

## 2014-06-21 NOTE — Telephone Encounter (Signed)
Fine to discontinue

## 2014-06-21 NOTE — Telephone Encounter (Signed)
Wendy Sims from [facility] called states pt and POA is requesting an order to discontinue the Advair Rx.  Pt refuses to use the inhaler however pt has not had any shortness of breath.  Please fax order to [phone].

## 2014-06-24 ENCOUNTER — Ambulatory Visit (INDEPENDENT_AMBULATORY_CARE_PROVIDER_SITE_OTHER): Payer: Medicare Other | Admitting: Cardiovascular Disease

## 2014-06-24 ENCOUNTER — Encounter: Payer: Self-pay | Admitting: Cardiovascular Disease

## 2014-06-24 VITALS — BP 120/80 | HR 90 | Ht 59.0 in | Wt 105.0 lb

## 2014-06-24 DIAGNOSIS — I4891 Unspecified atrial fibrillation: Secondary | ICD-10-CM

## 2014-06-24 DIAGNOSIS — N183 Chronic kidney disease, stage 3 unspecified: Secondary | ICD-10-CM

## 2014-06-24 DIAGNOSIS — I270 Primary pulmonary hypertension: Secondary | ICD-10-CM

## 2014-06-24 DIAGNOSIS — I272 Pulmonary hypertension, unspecified: Secondary | ICD-10-CM

## 2014-06-24 DIAGNOSIS — R2681 Unsteadiness on feet: Secondary | ICD-10-CM

## 2014-06-24 DIAGNOSIS — I10 Essential (primary) hypertension: Secondary | ICD-10-CM

## 2014-06-24 NOTE — Assessment & Plan Note (Signed)
Blood pressure is well controlled on today's visit. No changes made to the medications. 

## 2014-06-24 NOTE — Patient Instructions (Signed)
You are doing well. No medication changes were made.  Please call us if you have new issues that need to be addressed before your next appt.  Your physician wants you to follow-up in: 6 months.  You will receive a reminder letter in the mail two months in advance. If you don't receive a letter, please call our office to schedule the follow-up appointment.   

## 2014-06-24 NOTE — Assessment & Plan Note (Signed)
Heart rate relatively well-controlled. Currently not on anticoagulation as she is a high fall risk, high bleeding risk given her age

## 2014-06-24 NOTE — Assessment & Plan Note (Signed)
She appears relatively stable. Recommended that she continue on her Lasix 20 mg daily. She has orders for extra Lasix for worsening shortness of breath or leg edema. She has not needed this recently

## 2014-06-24 NOTE — Progress Notes (Signed)
Patient ID: Wendy Sims, female    DOB: 1909/04/30, [age] y.o.   MRN: [id number]  HPI Comments: [age] YO female with h/o  Diastolic CHF,  severe pulmonary hypertension , chronic atrial fibrillation, chronic hyponatremia, anemia, CKD (worse with diuresis) presents for follow up of her CHF.  In follow-up today, she reports that she is not doing very good. She has significant diffuse arthritis, particularly in her shoulders She walks with a cane, requires support. She denies any significant lower extremity edema,  She does have chronic mild shortness of breath with exertion, as is stable She is on Lasix 20 g daily She denies any recent falls, balance is poor   Not very active at baseline. No chest pain, dyspnea at rest.  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  During hospitalization, diuresed with IV lasix, however had worsening renal function . discharged home to skilled nursing.    Carvedilol was decreased down to 3.125 mg twice a day presumably for low blood pressure   Echocardiogram dated 11/25/2012 shows ejection fraction 50-55%, mild to moderate TR, right ventricular systolic pressure estimated at 75 mmHg    Outpatient Encounter Prescriptions as of 06/24/2014  Medication Sig  . acetaminophen (TYLENOL) 500 MG tablet Take 500 mg by mouth. Take 1 every morning, 1 in the evening as needed and 2 at night as needed  . ADVAIR DISKUS 100-50 MCG/DOSE AEPB INHALE 1 PUFF BY MOUTH EVERY 12 HOURS. RINSE MOUTH AFTER USING  . aspirin 81 MG tablet Take 81 mg by mouth daily.  . carvedilol (COREG) 3.125 MG tablet Take 1 tablet (3.125 mg  total) by mouth two times  daily with meals  . cetirizine (ZYRTEC) 10 MG tablet Take 1 tablet (10 mg total) by  mouth daily.  . furosemide (LASIX) 20 MG tablet Take 20 mg by mouth 2 (two) times daily as needed.  Marland Kitchen. gentamicin ointment (GARAMYCIN) 0.1 % Apply 1 application topically 2 (two) times daily.  Marland Kitchen. ibuprofen (ADVIL,MOTRIN) 400 MG tablet Take 400 mg by mouth 3 (three) times daily.  Marland Kitchen. ipratropium-albuterol (DUONEB) 0.5-2.5 (3) MG/3ML SOLN Take 3 mLs by nebulization every 4 (four) hours as needed.  . polyethylene glycol (MIRALAX / GLYCOLAX) packet Take 17 g by mouth daily as needed.  . sodium phosphate (FLEET) 7-19 GM/118ML ENEM Place 133 mLs (1 enema total) rectally daily as needed for severe constipation.  . vitamin B-12 (CYANOCOBALAMIN) 1000 MCG tablet Take 1,000 mcg by mouth daily.  Marland Kitchen. zolpidem (AMBIEN) 5 MG tablet Take 0.5 tablets (2.5 mg total) by mouth at bedtime as needed for sleep.   Social history  reports that she has never smoked. She has never used smokeless tobacco. She reports that she drinks about 0.6 oz of alcohol per week. She reports that she does not use illicit drugs.  Review of Systems  Constitutional: Negative.   Respiratory: Positive for shortness of breath.   Cardiovascular: Negative.   Gastrointestinal: Negative.   Musculoskeletal: Positive for gait problem.  Skin: Negative.   Neurological: Negative.   Psychiatric/Behavioral: Negative.   All other systems reviewed and are negative.   BP 120/80 mmHg  Pulse 90  Ht 4\' 11"  (1.499 m)  Wt 105 lb (47.628 kg)  BMI 21.20 kg/m2  Physical  Exam  Constitutional: She is oriented to person, place, and time.  Very frail , thin  HENT:  Head: Normocephalic.  Nose: Nose normal.  Mouth/Throat: Oropharynx is clear and moist.  Eyes: Conjunctivae are normal.  Neck: Normal range of motion. Neck supple. No JVD present.  Cardiovascular: Normal rate, S1 normal, S2 normal, normal heart sounds and intact distal pulses.  An irregularly irregular rhythm present. Exam reveals no gallop and no friction rub.   No murmur  heard. Pulmonary/Chest: Effort normal and breath sounds normal. No respiratory distress. She has no wheezes. She has no rales. She exhibits no tenderness.  Abdominal: Soft. Bowel sounds are normal. She exhibits no distension. There is no tenderness.  Musculoskeletal: Normal range of motion. She exhibits no edema or tenderness.  Lymphadenopathy:    She has no cervical adenopathy.  Neurological: She is alert and oriented to person, place, and time. Coordination normal.  Skin: Skin is warm and dry. No rash noted. No erythema.  Psychiatric: She has a normal mood and affect. Her behavior is normal. Judgment and thought content normal.    Assessment and Plan  Nursing note and vitals reviewed.

## 2014-06-24 NOTE — Assessment & Plan Note (Signed)
Walks with a walker, significant leg weakness, no recent falls Significant kyphosis

## 2014-06-24 NOTE — Assessment & Plan Note (Signed)
No recent lab work available for review. Not requiring excessive diuretic doses. Does not appear dehydrated on today's visit

## 2014-08-02 ENCOUNTER — Ambulatory Visit (INDEPENDENT_AMBULATORY_CARE_PROVIDER_SITE_OTHER): Payer: Medicare Other | Admitting: Internal Medicine

## 2014-08-02 ENCOUNTER — Encounter: Payer: Self-pay | Admitting: Internal Medicine

## 2014-08-02 ENCOUNTER — Other Ambulatory Visit: Payer: Self-pay | Admitting: Internal Medicine

## 2014-08-02 VITALS — BP 164/90 | HR 90 | Temp 97.5°F | Ht 59.0 in | Wt 104.5 lb

## 2014-08-02 DIAGNOSIS — N183 Chronic kidney disease, stage 3 unspecified: Secondary | ICD-10-CM

## 2014-08-02 DIAGNOSIS — F329 Major depressive disorder, single episode, unspecified: Secondary | ICD-10-CM

## 2014-08-02 DIAGNOSIS — F32A Depression, unspecified: Secondary | ICD-10-CM

## 2014-08-02 DIAGNOSIS — I10 Essential (primary) hypertension: Secondary | ICD-10-CM

## 2014-08-02 DIAGNOSIS — I5032 Chronic diastolic (congestive) heart failure: Secondary | ICD-10-CM

## 2014-08-02 DIAGNOSIS — I4891 Unspecified atrial fibrillation: Secondary | ICD-10-CM

## 2014-08-02 DIAGNOSIS — S81802D Unspecified open wound, left lower leg, subsequent encounter: Secondary | ICD-10-CM

## 2014-08-02 NOTE — Assessment & Plan Note (Signed)
BP Readings from Last 3 Encounters:  08/02/14 164/90  06/24/14 120/80  05/03/14 100/60   BP generally has been well controlled, slightly elevated today. Will continue to monitor. Continue current medications.

## 2014-08-02 NOTE — Progress Notes (Signed)
Subjective:    Patient ID: Wendy Sims, female    DOB: Sep 16, 1908, [age] y.o.   MRN: [id number]  HPI 80YO female presents for follow up.  She is tearful today. Sad that her entire family has died over the years and she is last one left. Feels ready to die.  Daughter is concerned that she is not eating much. Typically only eating carbohydrates and limiting fruits and veggies. Trying to supplement with Glucerna.  Wound on left lower leg has been relatively stable. Continues to apply topical gentamicin periodically. Staff have noted new scratch on left cheek. Not sure how this happened. Not painful.  Wt Readings from Last 3 Encounters:  08/02/14 104 lb 8 oz (47.401 kg)  06/24/14 105 lb (47.628 kg)  05/03/14 104 lb 8 oz (47.401 kg)      Past medical, surgical, family and social history per today's encounter.  Review of Systems  Constitutional: Negative for fever, chills, appetite change, fatigue and unexpected weight change.  Eyes: Negative for visual disturbance.  Respiratory: Negative for shortness of breath.   Cardiovascular: Negative for chest pain and leg swelling.  Gastrointestinal: Negative for nausea, vomiting, abdominal pain, diarrhea and constipation.  Musculoskeletal: Positive for myalgias and arthralgias (shoulders).  Skin: Positive for wound. Negative for color change and rash.  Hematological: Negative for adenopathy. Does not bruise/bleed easily.  Psychiatric/Behavioral: Positive for dysphoric mood. Negative for suicidal ideas and sleep disturbance. The patient is not nervous/anxious.        Objective:    BP 164/90 mmHg  Pulse 90  Temp(Src) 97.5 F (36.4 C) (Oral)  Ht 4\' 11"  (1.499 m)  Wt 104 lb 8 oz (47.401 kg)  BMI 21.10 kg/m2  SpO2 97% Physical Exam  Constitutional: She is oriented to person, place, and time. She appears well-developed and well-nourished. No distress.  HENT:  Head: Normocephalic and atraumatic.  Right Ear: External ear normal.    Left Ear: External ear normal.  Nose: Nose normal.  Mouth/Throat: Oropharynx is clear and moist. No oropharyngeal exudate.  Eyes: Conjunctivae are normal. Pupils are equal, round, and reactive to light. Right eye exhibits no discharge. Left eye exhibits no discharge. No scleral icterus.  Neck: Normal range of motion. Neck supple. No tracheal deviation present. No thyromegaly present.  Cardiovascular: Normal rate, normal heart sounds and intact distal pulses.  An irregularly irregular rhythm present. Exam reveals no gallop and no friction rub.   No murmur heard. Pulmonary/Chest: Effort normal. No accessory muscle usage. No tachypnea. No respiratory distress. She has no decreased breath sounds. She has no wheezes. She has rhonchi (few rare scattered). She has no rales. She exhibits no tenderness.  Musculoskeletal: Normal range of motion. She exhibits no edema or tenderness.  Lymphadenopathy:    She has no cervical adenopathy.  Neurological: She is alert and oriented to person, place, and time. No cranial nerve deficit. She exhibits normal muscle tone. Coordination normal.  Skin: Skin is warm and dry. No rash noted. She is not diaphoretic. No erythema. No pallor.     Psychiatric: Her speech is normal and behavior is normal. Judgment and thought content normal. Cognition and memory are normal. She exhibits a depressed mood.          Assessment & Plan:   Problem List Items Addressed This Visit      Unprioritized   Atrial fibrillation    HR well controlled. No anticoagulation given high fall risk.    Chronic diastolic congestive heart failure - Primary  Euvolemic on exam today. Continue current medications.    Chronic kidney disease (CKD), stage III (moderate)    Last renal function 04/2014 was stable. Per her preference, will hold on recheck until 10/2014.    Depression    Tearful today. Feels "ready to die" and sad about family members who have passed on. Offered support today with  her daughter. Strong family support in place. Will continue to monitor.    Hypertension    BP Readings from Last 3 Encounters:  08/02/14 164/90  06/24/14 120/80  05/03/14 100/60   BP generally has been well controlled, slightly elevated today. Will continue to monitor. Continue current medications.    Open wound of left lower leg    Relatively stable. Does not appear infected. Will stop Gentamicin. Monitor. Discussed referral back to dermatology, but pt prefers to hold off for now.        Return in about 3 months (around 11/01/2014) for Recheck.

## 2014-08-02 NOTE — Assessment & Plan Note (Signed)
HR well controlled. No anticoagulation given high fall risk.

## 2014-08-02 NOTE — Assessment & Plan Note (Signed)
Last renal function 04/2014 was stable. Per her preference, will hold on recheck until 10/2014.

## 2014-08-02 NOTE — Assessment & Plan Note (Signed)
Euvolemic on exam today. Continue current medications.

## 2014-08-02 NOTE — Assessment & Plan Note (Signed)
Tearful today. Feels "ready to die" and sad about family members who have passed on. Offered support today with her daughter. Strong family support in place. Will continue to monitor.

## 2014-08-02 NOTE — Assessment & Plan Note (Signed)
Relatively stable. Does not appear infected. Will stop Gentamicin. Monitor. Discussed referral back to dermatology, but pt prefers to hold off for now.

## 2014-08-02 NOTE — Progress Notes (Signed)
Pre visit review using our clinic review tool, if applicable. No additional management support is needed unless otherwise documented below in the visit note. 

## 2014-08-02 NOTE — Patient Instructions (Signed)
Follow up in 3 months

## 2014-09-02 ENCOUNTER — Other Ambulatory Visit: Payer: Self-pay | Admitting: *Deleted

## 2014-09-02 DIAGNOSIS — G4700 Insomnia, unspecified: Secondary | ICD-10-CM

## 2014-09-02 MED ORDER — ZOLPIDEM TARTRATE 5 MG PO TABS: 2.5000 mg | ORAL_TABLET | Freq: Every evening | ORAL | Status: DC | PRN

## 2014-09-21 ENCOUNTER — Other Ambulatory Visit: Payer: Self-pay | Admitting: Internal Medicine

## 2014-09-21 NOTE — Telephone Encounter (Signed)
Last OV 12.28.15.  Please advise refill

## 2014-10-03 ENCOUNTER — Inpatient Hospital Stay: Payer: Self-pay | Admitting: Internal Medicine

## 2014-10-04 ENCOUNTER — Telehealth: Payer: Self-pay | Admitting: Cardiovascular Disease

## 2014-10-04 ENCOUNTER — Ambulatory Visit: Payer: Self-pay | Admitting: Internal Medicine

## 2014-10-04 NOTE — Telephone Encounter (Signed)
Patients daughter wants Dr. Mariah MillingGollan to know [facility] is [facility] for fluid around heart.

## 2014-10-05 ENCOUNTER — Telehealth: Payer: Self-pay

## 2014-10-05 NOTE — Telephone Encounter (Signed)
The patient has been d/c from the hosp and is following up Friday (11th)

## 2014-10-15 ENCOUNTER — Ambulatory Visit: Payer: Medicare Other | Admitting: Internal Medicine

## 2014-10-18 ENCOUNTER — Ambulatory Visit (INDEPENDENT_AMBULATORY_CARE_PROVIDER_SITE_OTHER): Payer: Medicare Other | Admitting: Internal Medicine

## 2014-10-18 ENCOUNTER — Encounter: Payer: Self-pay | Admitting: Internal Medicine

## 2014-10-18 VITALS — BP 114/70 | HR 79 | Temp 97.9°F | Resp 16 | Ht 59.0 in | Wt 102.0 lb

## 2014-10-18 DIAGNOSIS — I5032 Chronic diastolic (congestive) heart failure: Secondary | ICD-10-CM | POA: Diagnosis not present

## 2014-10-18 DIAGNOSIS — Z85828 Personal history of other malignant neoplasm of skin: Secondary | ICD-10-CM | POA: Diagnosis not present

## 2014-10-18 NOTE — Patient Instructions (Signed)
Please change Furosemide to 20mg  at 8am and 20mg  at 2pm.  Follow up in 4 weeks and prn.

## 2014-10-18 NOTE — Progress Notes (Signed)
Subjective:    Patient ID: Wendy Sims, female    DOB: 1908-10-04, 81 y.o.   MRN: [id number]  HPI 81YO female presents for hospital follow up.  ADMITTED: 10/03/2014 DISCHARGED: 10/04/2014  DIAGNOSIS: shortness of breath secondary to acute on chronic diastolic heart failure  Started on Lasix  twice daily. Currently having doses at 8am and 8pm. Keeping her awake at night. Denies shortness of breath, chest pain. No concerns today. Decided not to follow up with [facility] Radiation Oncology tomorrow. No pain or itching at site of previous XRT.  Past medical, surgical, family and social history per today's encounter.  Review of Systems  Constitutional: Negative for fever, chills, appetite change, fatigue and unexpected weight change.  Eyes: Negative for visual disturbance.  Respiratory: Negative for chest tightness and shortness of breath.   Cardiovascular: Negative for chest pain and leg swelling.  Gastrointestinal: Negative for abdominal pain.  Skin: Negative for color change and rash.  Hematological: Negative for adenopathy. Does not bruise/bleed easily.  Psychiatric/Behavioral: Negative for dysphoric mood. The patient is not nervous/anxious.        Objective:    BP 114/70 mmHg  Pulse 79  Temp(Src) 97.9 F (36.6 C) (Oral)  Resp 16  Ht  (1.499 m)  Wt 102 lb (46.267 kg)  BMI 20.59 kg/m2  SpO2 95% Physical Exam  Constitutional: She is oriented to person, place, and time. She appears well-developed and well-nourished. No distress.  HENT:  Head: Normocephalic and atraumatic.  Right Ear: External ear normal.  Left Ear: External ear normal.  Nose: Nose normal.  Mouth/Throat: Oropharynx is clear and moist. No oropharyngeal exudate.  Eyes: Conjunctivae are normal. Pupils are equal, round, and reactive to light. Right eye exhibits no discharge. Left eye exhibits no discharge. No scleral icterus.  Neck: Normal range of motion. Neck supple. No tracheal  deviation present. No thyromegaly present.  Cardiovascular: Normal rate, regular rhythm, normal heart sounds and intact distal pulses.  Exam reveals no gallop and no friction rub.   No murmur heard. Pulmonary/Chest: Effort normal and breath sounds normal. No respiratory distress. She has no wheezes. She has no rales. She exhibits no tenderness.  Musculoskeletal: Normal range of motion. She exhibits no edema or tenderness.  Lymphadenopathy:    She has no cervical adenopathy.  Neurological: She is alert and oriented to person, place, and time. No cranial nerve deficit. She exhibits normal muscle tone. Coordination normal.  Skin: Skin is warm and dry. No rash noted. She is not diaphoretic. No erythema. No pallor.  Psychiatric: She has a normal mood and affect. Her behavior is normal. Judgment and thought content normal.          Assessment & Plan:   Problem List Items Addressed This Visit      Unprioritized   Chronic diastolic congestive heart failure - Primary    Symptoms improved with increase in lasix to  po bid. Will check electrolytes with labs today. Change timing on dosing to  po q8am and  po q2pm. Follow up with Dr. Mariah Milling in 12/2014 and here in 3 months and prn.      Relevant Orders   Comprehensive metabolic panel   Personal history of malignant neoplasm of skin    Pt has decided not to follow up with radiation oncology tomorrow at [facility]. Lesion on top of scalp appears unchanged with no erythema. Small anterior area of scaling. She will follow up with [facility] prn.  Return in about 3 months (around 01/18/2015) for Recheck.

## 2014-10-18 NOTE — Assessment & Plan Note (Signed)
Pt has decided not to follow up with [facility]. Lesion on top of scalp appears unchanged with no erythema. Small anterior area of scaling. She will follow up with [facility] prn.

## 2014-10-18 NOTE — Progress Notes (Signed)
Pre visit review using our clinic review tool, if applicable. No additional management support is needed unless otherwise documented below in the visit note. 

## 2014-10-18 NOTE — Assessment & Plan Note (Signed)
Symptoms improved with increase in lasix to 20mg  po bid. Will check electrolytes with labs today. Change timing on dosing to 20mg  po q8am and 20mg  po q2pm. Follow up with Dr. Mariah MillingGollan in 12/2014 and here in 3 months and prn.

## 2014-10-19 LAB — COMPREHENSIVE METABOLIC PANEL
ALK PHOS: 58 U/L (ref 39–117)
ALT: 6 U/L (ref 0–35)
AST: 16 U/L (ref 0–37)
Albumin: 4.1 g/dL (ref 3.5–5.2)
BUN: 36 mg/dL — AB (ref 6–23)
CHLORIDE: 102 meq/L (ref 96–112)
CO2: 28 mEq/L (ref 19–32)
Calcium: 9.6 mg/dL (ref 8.4–10.5)
Creatinine, Ser: 1.33 mg/dL — ABNORMAL HIGH (ref 0.40–1.20)
GFR: 38.68 mL/min — ABNORMAL LOW (ref >60.00)
Glucose, Bld: 80 mg/dL (ref 70–99)
Potassium: 4.4 mEq/L (ref 3.5–5.1)
SODIUM: 138 meq/L (ref 135–145)
Total Bilirubin: 0.5 mg/dL (ref 0.2–1.2)
Total Protein: 7.1 g/dL (ref 6.0–8.3)

## 2014-11-08 ENCOUNTER — Ambulatory Visit: Payer: Medicare Other | Admitting: Internal Medicine

## 2014-11-13 IMAGING — CR DG CHEST 2V
3 series · 3 of 3 positions shown · non-contrast
Comparison: 07/09/2012.

CLINICAL DATA: Shortness of breath.

EXAM:
CHEST  2 VIEW

[view not recorded (1 of 3)]
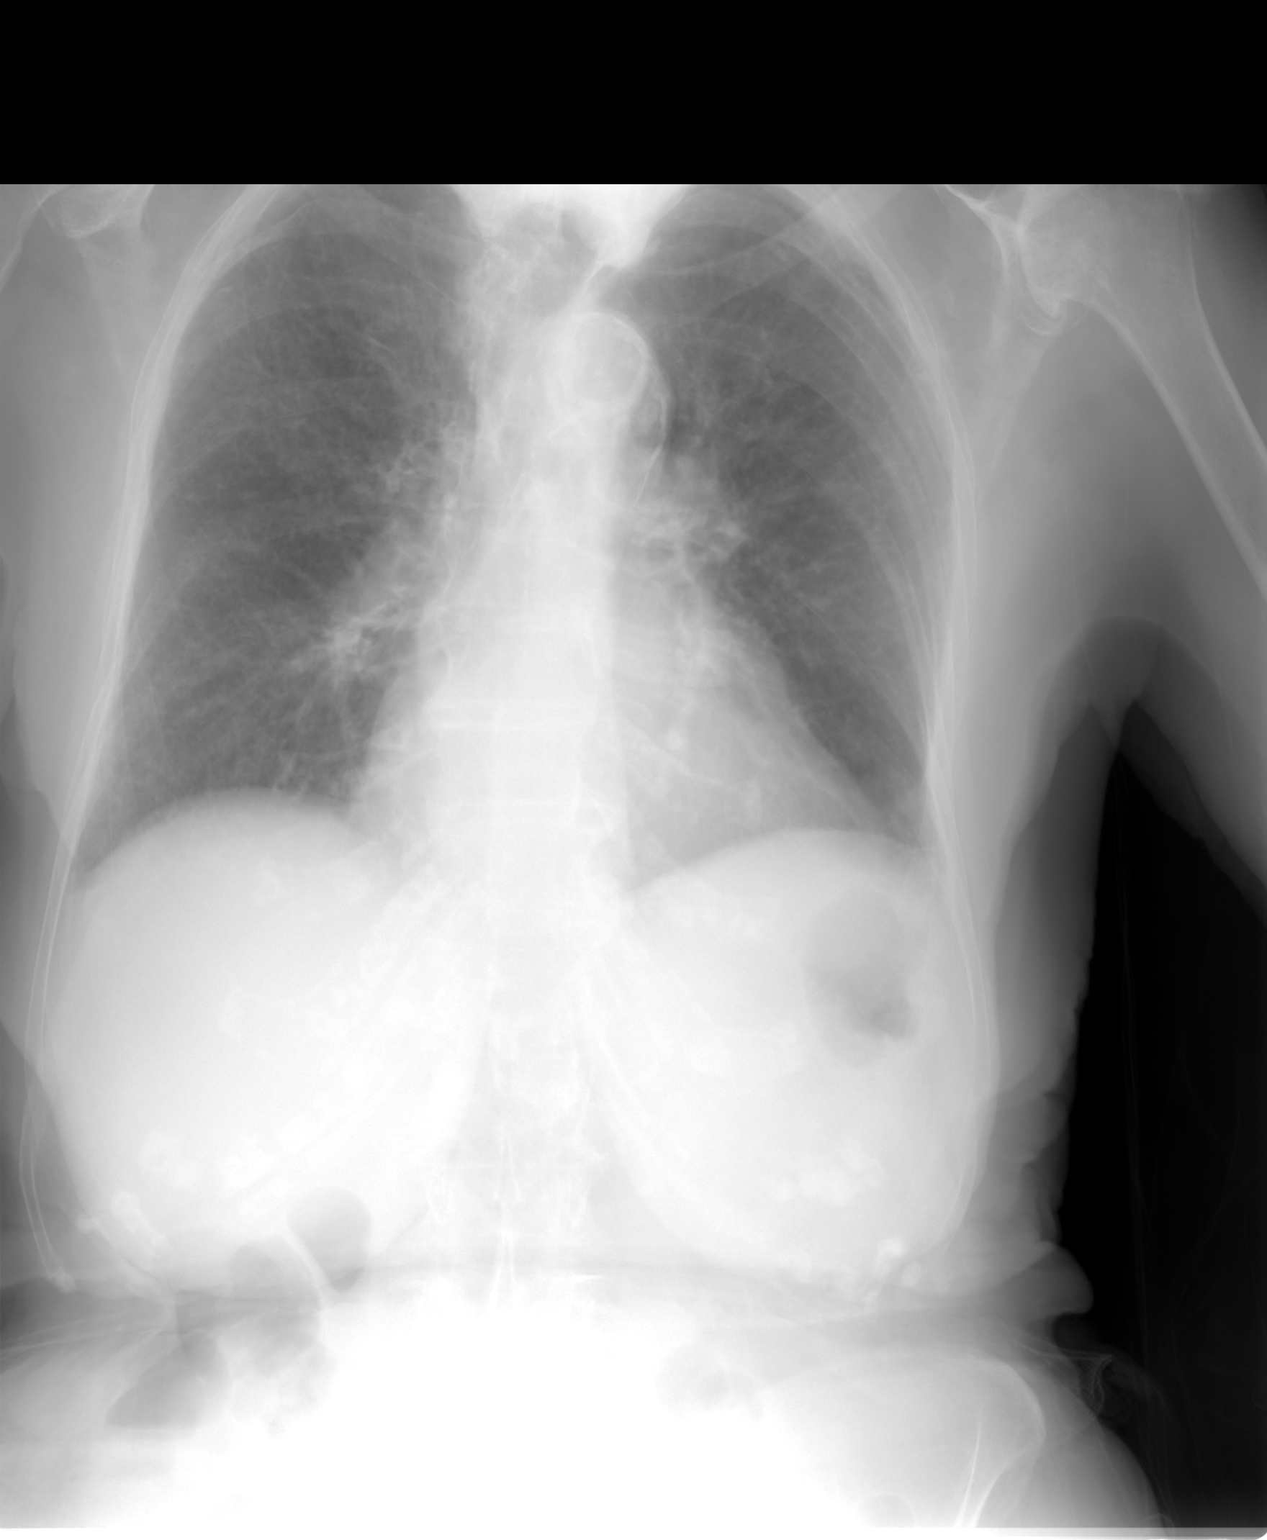

[view not recorded (2 of 3)]
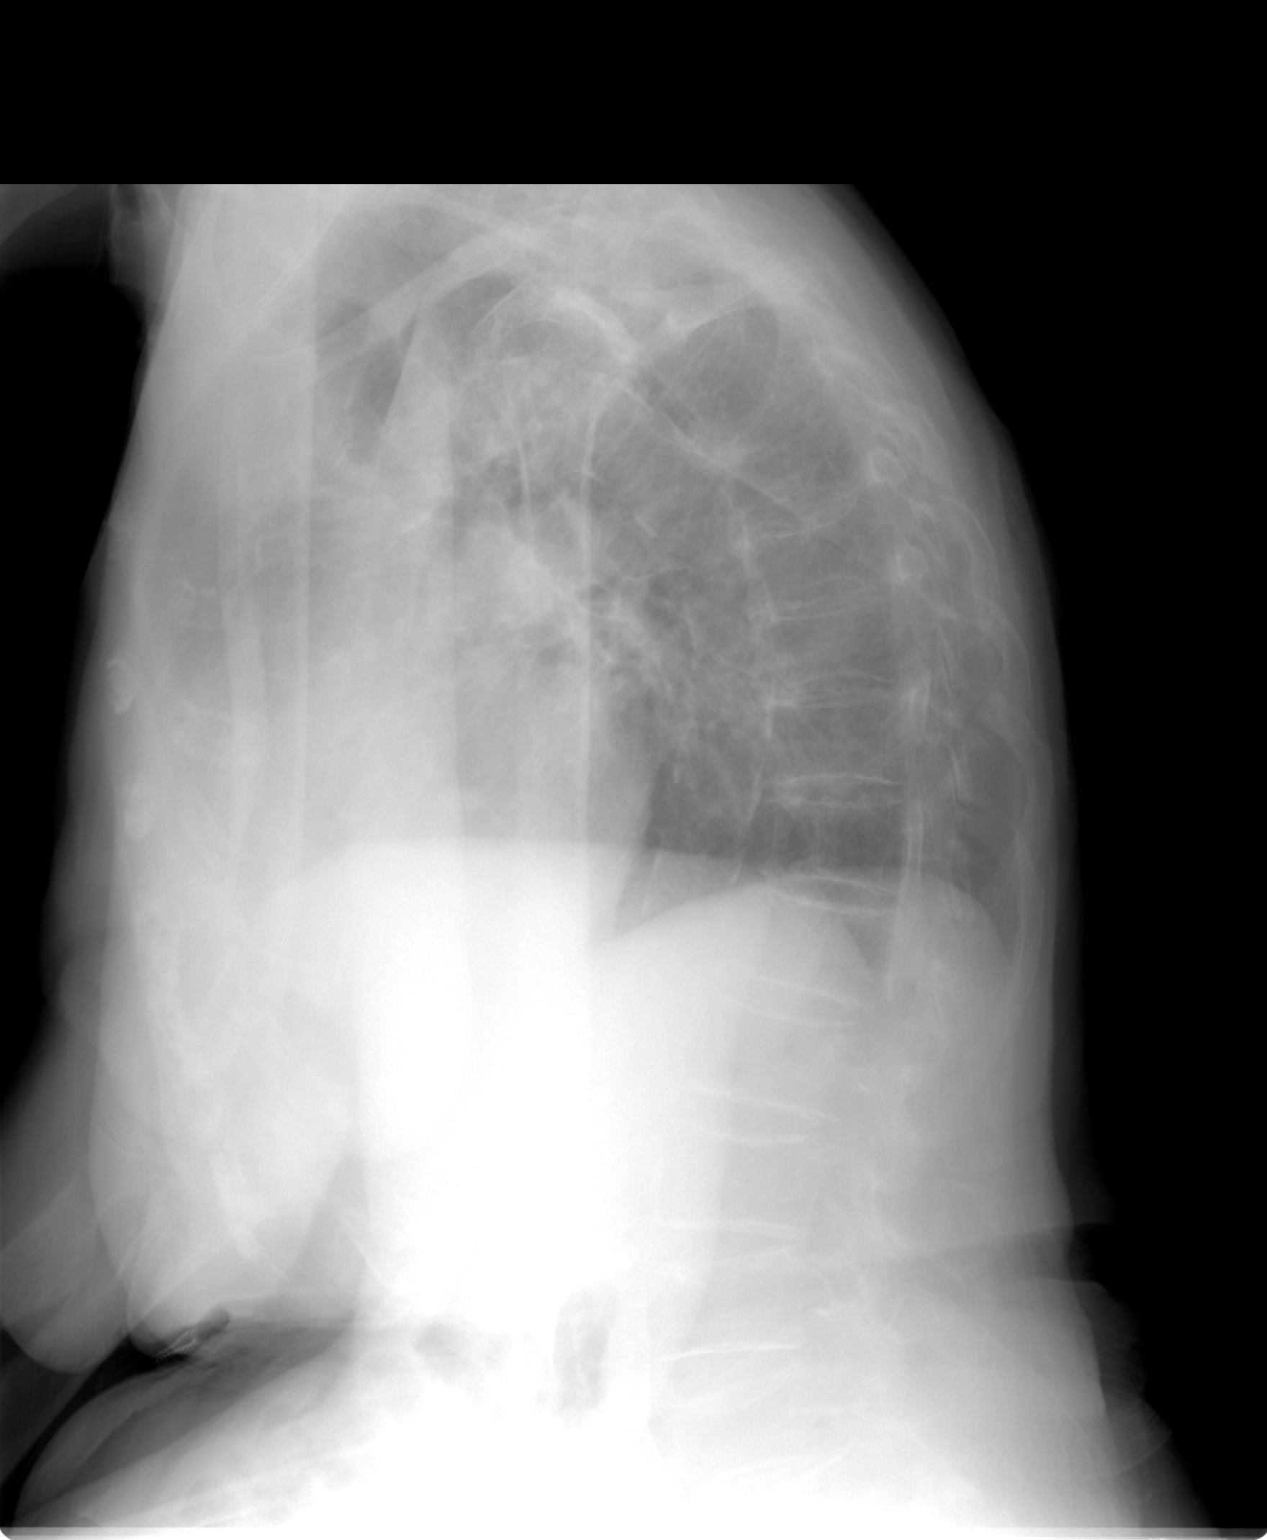

[view not recorded (3 of 3)]
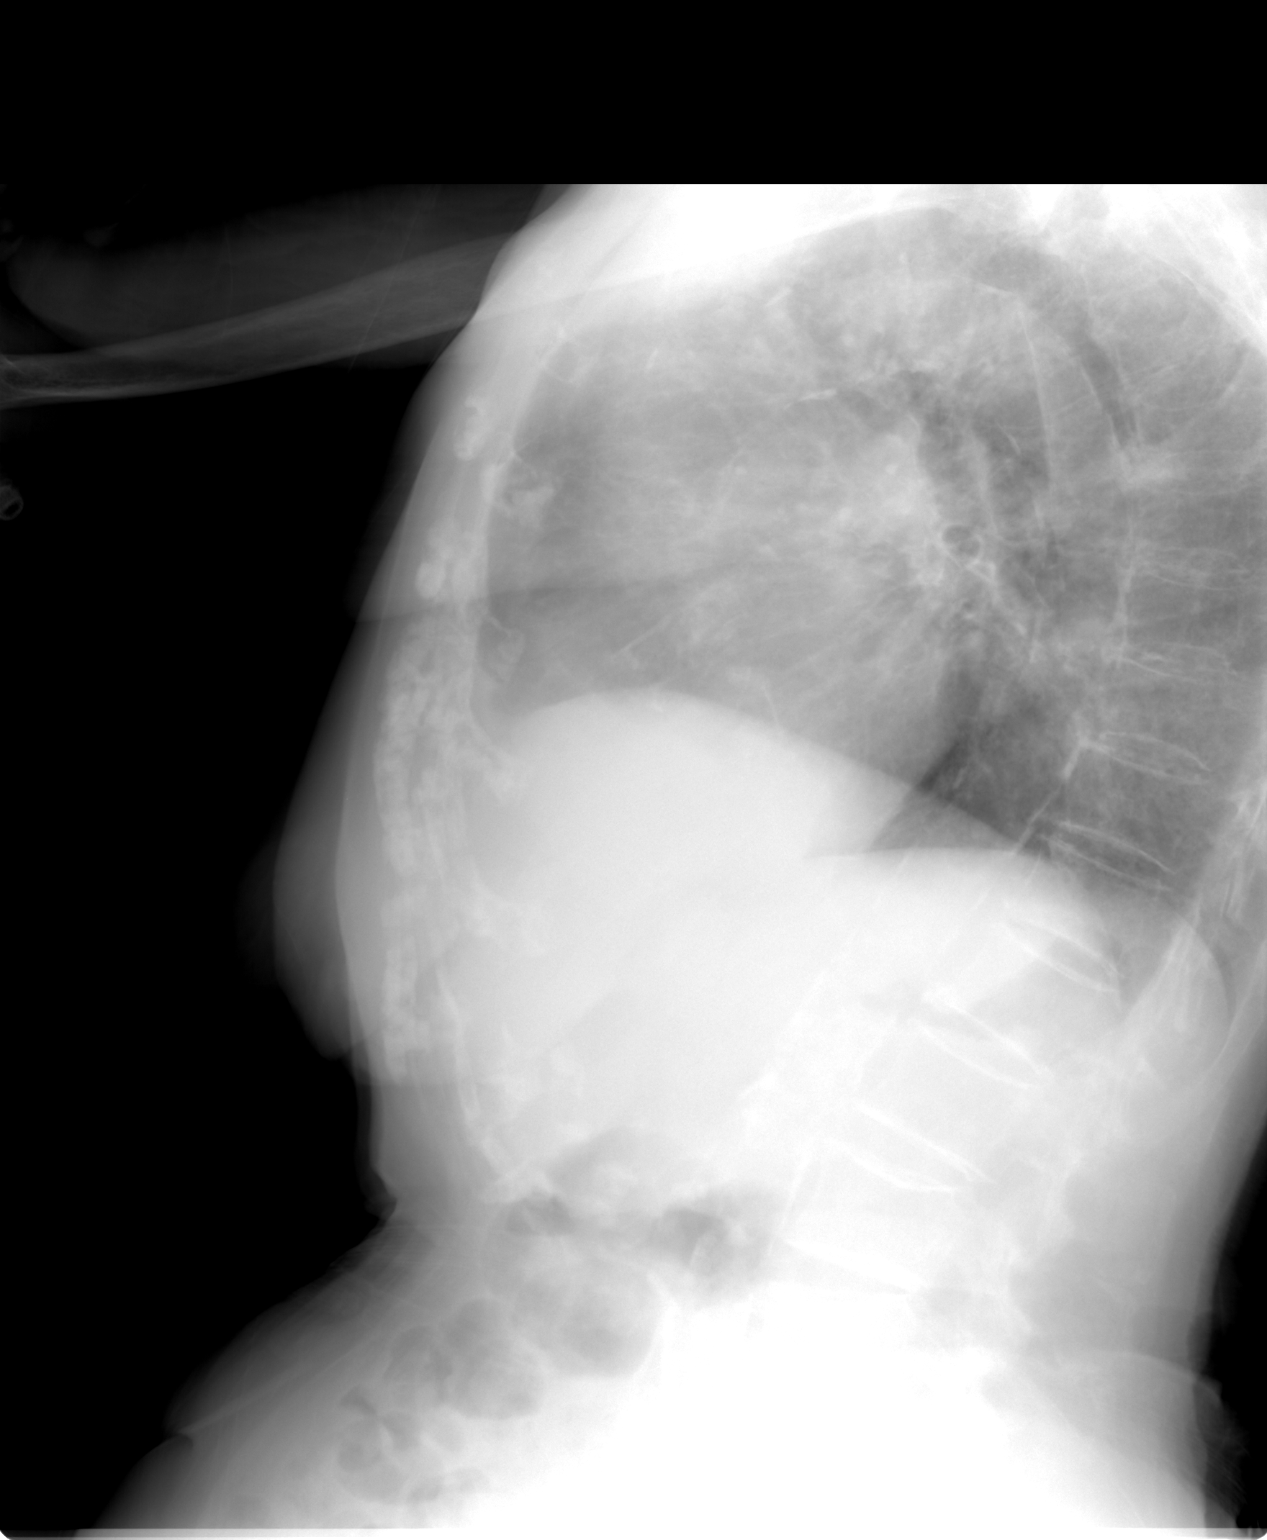

[3 of 3 positions shown; findings below may reference images not displayed]

FINDINGS: Mediastinum and hilar structures are normal. The lungs are clear.
Cardiomegaly with normal pulmonary vascularity. Degenerative changes
thoracic spine. Degenerative changes both shoulders.
IMPRESSION: 1. No acute cardiopulmonary disease.
2. Degenerative changes both shoulders and thoracic spine.

## 2014-11-17 ENCOUNTER — Ambulatory Visit (INDEPENDENT_AMBULATORY_CARE_PROVIDER_SITE_OTHER): Payer: Medicare Other | Admitting: Cardiovascular Disease

## 2014-11-17 ENCOUNTER — Other Ambulatory Visit: Payer: Self-pay | Admitting: Internal Medicine

## 2014-11-17 ENCOUNTER — Encounter: Payer: Self-pay | Admitting: Cardiovascular Disease

## 2014-11-17 VITALS — BP 120/80 | HR 69 | Ht 59.0 in | Wt 101.5 lb

## 2014-11-17 DIAGNOSIS — R531 Weakness: Secondary | ICD-10-CM

## 2014-11-17 DIAGNOSIS — I10 Essential (primary) hypertension: Secondary | ICD-10-CM | POA: Diagnosis not present

## 2014-11-17 DIAGNOSIS — N183 Chronic kidney disease, stage 3 unspecified: Secondary | ICD-10-CM

## 2014-11-17 DIAGNOSIS — I5032 Chronic diastolic (congestive) heart failure: Secondary | ICD-10-CM | POA: Diagnosis not present

## 2014-11-17 DIAGNOSIS — R609 Edema, unspecified: Secondary | ICD-10-CM

## 2014-11-17 DIAGNOSIS — I4891 Unspecified atrial fibrillation: Secondary | ICD-10-CM

## 2014-11-17 NOTE — Assessment & Plan Note (Signed)
Chronic atrial fibrillation. Heart rate well controlled. Not a good candidate for aggressive anticoagulation

## 2014-11-17 NOTE — Assessment & Plan Note (Signed)
No significant leg edema on today's visit. We'll continue Lasix 20 mg twice a day. May need to decrease the dose for any worsening renal function

## 2014-11-17 NOTE — Progress Notes (Signed)
Patient ID: Wendy Sims, female    DOB: 11/11/1908, [age] y.o.   MRN: [id number]  HPI Comments: [id number] YO female with h/o  Diastolic CHF,  severe pulmonary hypertension , chronic atrial fibrillation, chronic hyponatremia, anemia, CKD (worse with diuresis) presents for follow up of her CHF.  In follow-up today, she reports that she is doing about the same as before. Lasix has been increased from 20 mg daily up to 20 mg twice a day after hospital evaluation for shortness of breath. On 20 minute grams twice a day, family feels that she is stable. She denies any significant lower extremity edema. Minimal cough. She was hoping to cut back on her medications. She walks with a walker, legs are getting weaker. No recent falls. Continues to have severe arthritis in her hands, shoulders. Family reports that she is losing weight. She is picky with what she eats. Does not want supplemental shakes,  too sweet. no shortness of breath or chest pain at rest    EKG on today's visit shows atrial fibrillation with ventricular rate 69 bpm, no significant ST or T-wave changes Other past medical history  Previous hospital admissions for worsening shortness of breath, none recently Admitted July 14th for shortness of breath and LE edema.  During hospitalization, diuresed with IV lasix, however had worsening renal function . discharged home to skilled nursing.    Carvedilol was decreased down to 3.125 mg twice a day presumably for low blood pressure   Echocardiogram dated 11/25/2012 shows ejection fraction 50-55%, mild to moderate TR, right ventricular systolic pressure estimated at 75 mmHg    Allergies  Allergen Reactions  . Neomycin-Bacitracin Zn-Polymyx     REACTION: Unsure of reaction    Outpatient Encounter Prescriptions as of 11/17/2014  Medication Sig  . albuterol (ACCUNEB) 1.25 MG/3ML nebulizer solution Take 1 ampule by nebulization every 6 (six) hours as needed for wheezing.  . ASPIR-LOW 81  MG EC tablet TAKE 1 TABLET BY MOUTH ONCE DAILY AT BEDTIME FOR CIRCULATION  . carvedilol (COREG) 3.125 MG tablet TAKE 1 TABLET BY MOUTH TWICE DAILY WITH A MEAL FOR HEART RATE AND BP  . furosemide (LASIX) 20 MG tablet Take 20 mg by mouth 2 (two) times daily.   Marland Kitchen. ibuprofen (ADVIL,MOTRIN) 400 MG tablet Take 400 mg by mouth 3 (three) times daily.  Marland Kitchen. ipratropium-albuterol (DUONEB) 0.5-2.5 (3) MG/3ML SOLN Take 3 mLs by nebulization every 4 (four) hours as needed.  Marland Kitchen. MAPAP 500 MG tablet TAKE 1 TABLET BY MOUTH EACH MORNING AND 2 TABLETS BY MOUTH EACH EVENING  . polyethylene glycol (MIRALAX / GLYCOLAX) packet Take 17 g by mouth daily as needed.  . sodium phosphate (FLEET) 7-19 GM/118ML ENEM Place 133 mLs (1 enema total) rectally daily as needed for severe constipation.  Marland Kitchen. zolpidem (AMBIEN) 5 MG tablet Take 0.5 tablets (2.5 mg total) by mouth at bedtime as needed for sleep.  . [DISCONTINUED] aspirin 81 MG tablet Take 81 mg by mouth daily.  . [DISCONTINUED] gentamicin ointment (GARAMYCIN) 0.1 % Apply 1 application topically 2 (two) times daily. (Patient not taking: Reported on 11/17/2014)    Past Medical History  Diagnosis Date  . Asthma   . COPD (chronic obstructive pulmonary disease)   . Pulmonary fibrosis   . CHF (congestive heart failure)   . HTN (hypertension)   . Hyperlipidemia   . Osteoporosis   . Ischemic cardiomyopathy     w/low EJ of 20-25%  . Atrial fibrillation     Past Surgical  History  Procedure Laterality Date  . Back surgery  1995    Lumbar  . Inguinal hernia repair  2010    Social History  reports that she has never smoked. She has never used smokeless tobacco. She reports that she drinks about 0.6 oz of alcohol per week. She reports that she does not use illicit drugs.  Family History family history includes Heart attack (age of onset: 28) in her father; Hyperlipidemia in her sister.   Review of Systems  Constitutional: Negative.   Respiratory: Negative.    Cardiovascular: Negative.   Gastrointestinal: Negative.   Musculoskeletal: Positive for gait problem.  Skin: Negative.   Neurological: Negative.   Psychiatric/Behavioral: Negative.   All other systems reviewed and are negative.   BP 120/80 mmHg  Pulse 69  Ht  (1.499 m)  Wt 101 lb 8 oz (46.04 kg)  BMI 20.49 kg/m2  Physical Exam  Constitutional: She is oriented to person, place, and time.  Very frail , thin  HENT:  Head: Normocephalic.  Nose: Nose normal.  Mouth/Throat: Oropharynx is clear and moist.  Eyes: Conjunctivae are normal.  Neck: Normal range of motion. Neck supple. No JVD present.  Cardiovascular: Normal rate, S1 normal, S2 normal, normal heart sounds and intact distal pulses.  An irregularly irregular rhythm present. Exam reveals no gallop and no friction rub.   No murmur heard. Pulmonary/Chest: Effort normal and breath sounds normal. No respiratory distress. She has no wheezes. She has no rales. She exhibits no tenderness.  Abdominal: Soft. Bowel sounds are normal. She exhibits no distension. There is no tenderness.  Musculoskeletal: Normal range of motion. She exhibits no edema or tenderness.  Lymphadenopathy:    She has no cervical adenopathy.  Neurological: She is alert and oriented to person, place, and time. Coordination normal.  Skin: Skin is warm and dry. No rash noted. No erythema.  Psychiatric: She has a normal mood and affect. Her behavior is normal. Judgment and thought content normal.    Assessment and Plan  Nursing note and vitals reviewed.

## 2014-11-17 NOTE — Assessment & Plan Note (Signed)
Appears relatively euvolemic on today's visit. Recommended she stay on Lasix 20 mg twice a day She may need to drink a little bit more fluids given small climb in her creatinine and BUN on recent lab work in March

## 2014-11-17 NOTE — Assessment & Plan Note (Signed)
Renal function seems to be related to her diuretic regimen. We'll need to check BMP periodically to avoid overdiuresis.

## 2014-11-17 NOTE — Assessment & Plan Note (Signed)
She is bothered by diffuse arthritis in her hands, shoulders, back

## 2014-11-17 NOTE — Assessment & Plan Note (Signed)
Blood pressure is well controlled on today's visit. No changes made to the medications. 

## 2014-11-17 NOTE — Patient Instructions (Signed)
You are doing well. No medication changes were made.  Please call us if you have new issues that need to be addressed before your next appt.  Your physician wants you to follow-up in: 6 months.  You will receive a reminder letter in the mail two months in advance. If you don't receive a letter, please call our office to schedule the follow-up appointment.   

## 2014-11-23 NOTE — Discharge Summary (Signed)
PATIENT NAME:  Wendy Sims, Wendy Sims MR#:  [id number] DATE OF BIRTH:  [birth year]  DATE OF ADMISSION:  07/15/2012 DATE OF DISCHARGE:  07/17/2012  REASON FOR ADMISSION: Cough and shortness of breath for three days.   FINAL DISCHARGE DIAGNOSES:  1. Chronic obstructive pulmonary disease exacerbation.  2. Ischemic cardiomyopathy. 3. Chronic atrial fibrillation. 4. Chronic kidney disease. 5. Hyperlipidemia. 6. History of congestive heart failure with diastolic dysfunction, compensated at this moment.  7. Hyponatremia.  8. Anemia of chronic disease.  9. Elevation of white blood count first due to infection then resolved, now due to steroids.   MEDICATIONS AT DISCHARGE: 1. Tylenol p.r.n. pain. 2. Coreg 6.25 mg twice daily.  3. Lasix 20 mg once daily.  4. Fiber tabs 625 mg once daily.  5. Albuterol Atrovent p.r.n. nebs. 6. Ambien 5 mg at bedtime. 7. Prednisone 20 mg tablets, take 2 p.o. for three days. 8. Lisinopril 10 mg once a day. 9. Aspirin 81 mg once a day. 10. Azithromycin 250 mg for seven days. 11. Acetylcysteine 600 mg every 12 hours.   LABORATORY, DIAGNOSTIC AND RADIOLOGICAL DATA: Glucose 109, BNP 7000, at discharge 4700. BUN 44, creatinine 1.16, sodium 126, chloride 91, GFR 38%, white blood count 12.3 at discharge. Hemoglobin 10.2, platelets 273. Urine sodium 9, urine osmolality 407. EKG: Atrial fibrillation. Chest x-ray interstitial pulmonary opacities similar to prior, likely due to chronic interstitial lung disease, interstitial fibrosis or interstitial edema.   HOSPITAL COURSE: Ms. Blossom Crume is a very nice 81 year old female who came with a history of increased shortness of breath for three days. The patient has history of ischemic cardiomyopathy, chronic obstructive pulmonary disease, congestive heart failure, atrial fibrillation. She has significant wheezing, shortness of breath and she has been very confused. Patient had no fever or chills. She was evaluated at the ER and  impression was that the patient had a chronic obstructive pulmonary disease exacerbation. Her chest x-ray showed interstitial pulmonary opacities similar to previous episodes and no significant changes of her hemodynamic status not looking like she was fluid overload. The patient was started on steroids and due to her hyponatremia was felt that she was severely dehydrated likely due to overdiuresis. Lasix was held for a day and then started low doses. Patient was put on small amounts of IV fluids. After the steroids the patient had significant improved to almost being at her baseline. I talked to the family and the patient and we think that the best approach for her would be to go to a skilled nursing facility. I want to get her out of the hospital sooner, rather than later because I do want her to catch any pneumonia or other infection. I explained this to the family and they agree. They feel like the risk is high especially in the condition that she is. She is actually doing much better. She is not wheezing anymore. She is on room air and she is actually feeling great wanting to be discharged. I am going to discharge her on steroids, nebulizers and we are going to do azithromycin to complete 10 days. The patient again did not look significantly in pulmonary edema and she is not having any crackles in her lungs. She does not look fluid overload. She is hyponatremic and I think this might be more of a chronic problem but she also looked dehydrated for what we encouraged just to drink normal amounts of fluid and her weight is due to be checked every day. If there is any significant change  in her weight, either two pounds up or two pounds down she is to contact her primary care physician for adjustment of her Lasix.   TIME SPENT: I spent about 40 minutes with this discharge today and she is going with [facility] home [facility].  ____________________________ Felipa Furnaceoberto Sanchez Gutierrez, MD rsg:cms D: 07/17/2012  15:59:29 ET T: 07/18/2012 11:31:31 ET JOB#: [id number]  cc: Felipa Furnaceoberto Sanchez Gutierrez, MD, <Dictator> Amany Rando Juanda ChanceSANCHEZ GUTIERRE MD ELECTRONICALLY SIGNED 07/20/2012 14:12

## 2014-11-23 NOTE — H&P (Signed)
PATIENT NAME:  Wendy, Sims MR#:  [id number] DATE OF BIRTH:  August 11, 1908  DATE OF ADMISSION:  07/15/2012  PRIMARY CARE PHYSICIAN: Ronna Polio, MD   REFERRING PHYSICIAN: Dr. Brien Mates    CHIEF COMPLAINT: Cough, shortness of breath for three days.   HISTORY OF PRESENT ILLNESS: The patient is a 81 year old Caucasian female with a history of ischemic cardiomyopathy, COPD, CHF, and atrial fibrillation who presented to the ED with cough, wheezing, and shortness of breath for the past three days. In addition, the patient has weakness and poor oral intake for three days. The patient is alert, awake, mildly confused. Denies any fever or chills. No headache or dizziness. No chest pain, palpitation, orthopnea, or nocturnal dyspnea. No leg edema. The patient denies any abdominal pain, nausea, vomiting, diarrhea. No urine problem. According to the patient's daughter, the patient had shortness of breath three weeks ago and was treated for CHF in the ED and sent back home.   PAST MEDICAL HISTORY: As mentioned above: 1. CHF. 2. Cardiomyopathy. 3. COPD.  4. Chronic atrial fibrillation. 5. Chronic CKD, stage III. 6. Hyperlipidemia. 7. Osteoporosis. 8. Recurrent UTIs. 9. Diverticulosis.   SOCIAL HISTORY: No smoking. No drinking or illicit drugs. Lives in assisted living facility.   FAMILY HISTORY: Unknown.   ALLERGIES: None.   HOME MEDICATIONS:  1. Zolpidem 5 mg p.o. at bedtime. 2. Lasix 20 mg p.o. once a day.  3. Fiber tab 625 mg p.o. daily. 4. Coreg 6.25 mg p.o. b.i.d.  5. Albuterol 2.5 mg/3 mL 3 mL inhaled 4 times a day p.r.n. for shortness of breath. 6. Tylenol 500 mg p.o. tablets twice a day.   REVIEW OF SYSTEMS: CONSTITUTIONAL: The patient denies any fever or chills. No headache or dizziness. Has generalized weakness. Decreased oral intake. EYES: No double vision or blurred vision. ENT: No postnasal drip, slurred speech, or dysphagia, but has hearing loss. CARDIOVASCULAR: No chest pain,  palpitation, orthopnea, or nocturnal dyspnea. No leg edema. PULMONARY: Positive for cough, wheezing, and shortness of breath. No sputum or hemoptysis. GI: No abdominal pain, nausea, vomiting, or diarrhea. No melena or bloody stools. GU: No dysuria, hematuria, or incontience. SKIN: No rash or jaundice. NEUROLOGY: No syncope, loss of consciousness, or seizure. HEMATOLOGY: No easy bruising or bleeding. PSYCH: No anxiety or depression.   PHYSICAL EXAMINATION:    VITAL SIGNS: Temperature 98.7, blood pressure 189/92, pulse 96, respirations 24, oxygen saturation 94% on room air.   GENERAL: The patient is alert, awake, oriented, in no acute distress.   HEENT: Pupils are round, equal, reactive to light and accommodation. Dry oral mucosa. Clear oropharynx.   NECK: Supple. No JVD or carotid bruit. No lymphadenopathy. No thyromegaly.   CARDIOVASCULAR: S1 and S2, irregular rate and rhythm. No murmurs or gallops.   PULMONARY: Bilateral air entry. Mild bilateral wheezing but no crackles or rales. No use of accessory muscles to breathe.   ABDOMEN: Soft. No distention or tenderness. No organomegaly. Bowel sounds present.   EXTREMITIES: No edema, clubbing, or cyanosis. No calf tenderness. Bilateral pedal pulses present.   SKIN: No rash or jaundice.   NEUROLOGY: Alert and oriented x3. No focal deficits. Power 5 out of 5. Sensation intact.   LABORATORY DATA: WBC 11.5, hemoglobin 10.7, platelets 252, glucose 92, BUN 24, creatinine 1.08, sodium 127, potassium 4.5, bicarb 28. BNP 7291. Troponin less than 0.02.   EKG showed atrial fibrillation at 86 beats per minute.   Chest x-ray showed interstitial pulmonary opacity similar to previous.   IMPRESSION:  1. COPD exacerbation.  2. Hypertensive urgency.  3. Dehydration.  4. Hyponatremia.  5. CHF. 6. Cardiomyopathy, stable.  7. Atrial fibrillation, rate controlled.  8. Anemia.   PLAN OF TREATMENT:  1. The patient will be admitted to tele floor.   2. Will give oxygen by nasal cannula, DuoNebs, and Zithromax.  3. We will start prednisone 40 mg p.o. daily.  4. For dehydration and hyponatremia, we will discontinue Lasix and give gentle rehydration normal saline 50 mL/h. Follow-up BMP.  5. We will continue Coreg and add lisinopril for hypertension and also will give hydralazine IV p.r.n.   Discussed the patient's situation and plan of treatment with the patient's daughter and granddaughter.   CODE STATUS: DO NOT RESUSCITATE.      TIME SPENT: About 56 minutes   ____________________________ Shaune PollackQing Katriana Dortch, MD qc:drc D: 07/15/2012 17:39:55 ET T: 07/16/2012 05:42:52 ET JOB#: [id number]  cc: Shaune PollackQing Katalyn Matin, MD, <Dictator> Ginette PitmanJennifer A. Dan HumphreysWalker, MD Shaune PollackQING Teniola Tseng MD ELECTRONICALLY SIGNED 07/18/2012 17:04

## 2014-11-23 NOTE — Consult Note (Signed)
PATIENT NAME:  Wendy Sims, Wendy MR#:  [id number] DATE OF BIRTH:  03/30/1909  DATE OF CONSULTATION:  06/28/2012  REFERRING PHYSICIAN:  Si Raiderhristine Braud, MD CONSULTING PHYSICIAN:  Orton Capell R. Latanga Nedrow, MD  REASON FOR CONSULTATION: Shortness of breath and hypertensive urgency.   HISTORY OF PRESENTING ILLNESS: The patient is a 81 year old Caucasian female patient with history of hypertension, ischemic cardiomyopathy, chronic obstructive pulmonary disease, chronic kidney chronic, and chronic atrial fibrillation who presents to the Emergency Room complaining of some shortness of breath. The patient was given two doses of albuterol in her assisted living facility. She did not feel much improved and presented to the Emergency Room. Here the patient was found to have some pulmonary edema, was given some Lasix, blood pressure found to be around 200 systolic, and the hospitalist team has been consulted for possible admission. The patient and her daughter at bedside would like to return home and follow-up with their primary care physician.   Presently, the patient feels better after a dose of IV Lasix. Blood pressure is trending down at 172/85. The patient is supposed to take Lasix when she notices some leg swelling, as per her primary care physician's instructions, but she has not been using Lasix. She is afebrile. Her troponin is less than 0.02.   PAST MEDICAL HISTORY:  1. Ischemic cardiomyopathy. 2. Chronic obstructive pulmonary disease. 3. Chronic atrial fibrillation. 4. Chronic kidney disease stage III. 5. Hyperlipidemia. 6. Osteoporosis. 7. Recurrent urinary tract infections.  8. Diverticulosis.   HOME MEDICATIONS: Unavailable at this time, but her daughter at bedside mentions that she is on Coreg 6.25 mg oral twice a day along with budesonide, Ambien, and Pulmicort.   ALLERGIES: No known drug allergies.   SOCIAL HISTORY: Does not smoke. No alcohol. No illicit drugs. Lives at a group home.   FAMILY  HISTORY: Reviewed and unknown.   REVIEW OF SYSTEMS: CONSTITUTIONAL: No fever, fatigue, or weakness. EYES: Has some blurred vision. No pain or redness. ENT: No tinnitus or ear pain. Has hearing loss. RESPIRATORY: Has shortness of breath and occasional wheeze. No cough. CARDIOVASCULAR: No chest pain, orthopnea, or edema. GASTROINTESTINAL: No nausea, vomiting, diarrhea, or abdominal pain. GENITOURINARY: No dysuria. Has some incontinence. ENDOCRINE: No polyuria, nocturia, or thyroid problems. HEMATOLOGIC/LYMPHATIC: No anemia, easy bruising, or bleeding. INTEGUMENT: No rash or lesions. MUSCULOSKELETAL: Has arthritis and generalized debility. NEUROLOGIC: No focal numbness, weakness, or dysarthria. Has dementia. PSYCHIATRIC: No anxiety or depression.   PHYSICAL EXAMINATION:   VITAL SIGNS: Temperature is 98.1, pulse 92, respirations 18, and blood pressure 172/86 but initially was 200 systolic, and saturating 97% on room air.   GENERAL: Elderly, frail Caucasian female patient lying in bed, comfortable, conversational, and cooperative with exam.   PSYCHIATRIC: Alert and oriented to person and place. Mood and affect appropriate. Judgment intact.   HEENT: Atraumatic, normocephalic. Oral mucosa moist and pink. External ears and nose normal. No pallor. No icterus. Pupils bilaterally equal and reactive to light.   NECK: Supple. No thyromegaly. No palpable lymph nodes. Trachea midline. No carotid bruit or JVD.   CARDIOVASCULAR: S1 and S2 regular rate and rhythm without any murmurs. Peripheral pulses 2+. No edema.   RESPIRATORY: Normal work of breathing. Clear to auscultation on both sides.   GASTROINTESTINAL: Soft abdomen, nontender. Bowel sounds present. No hepatosplenomegaly palpable.   SKIN: Warm and dry. No petechia, rash, or ulcers.   NEURO: Motor strength 5/5 in upper and lower extremities. Sensation to fine touch intact all over.   RESULTS: Glucose 96. BNP  3684. BUN 25, creatinine 1, sodium 132, and  potassium 4.5. Troponin less than 0.02. WBC 8.6 and hemoglobin 11.4.   Chest x-ray shows some mild interstitial edema.   EKG shows atrial fibrillation with no ST elevation.   ASSESSMENT AND PLAN:  1. Acute on chronic systolic congestive heart failure likely secondary from uncontrolled hypertension. The patient does not have any chest pain, cardiac enzymes are normal, and EKG does not show any ST elevation. I discussed with the daughter. The patient is already feeling better after one dose of IV Lasix. We  have her take scheduled Lasix for the next four days then the patient goes back to her as needed basis and follow-up with primary care physician in 1 to 2 days. The patient is on beta blocker which will be continued. I have discussed with the daughter regarding further work-up, but the patient is a DO NOT RESUSCITATE and the daughter does not want any aggressive measures as in stress test or catheterization.  2. Hypertensive urgency. We will increase her Coreg dose. The patient mentions that her blood pressure is well controlled at home. We will double her dose from 6.25 mg of Coreg to 12.5 mg twice a day. The patient will follow up with her primary care physician in 2 to 3 days.  3. Chronic kidney disease, stage III. Stable.  4. Anemia. Stable.   CODE STATUS: DNR/DNI.           TIME SPENT: Time spent on this complex consult in the ER was 65 minutes with more than 50% time spent in coordination of care.  ____________________________ Molinda Bailiff. Atley Neubert, MD srs:slb D: 06/28/2012 15:54:25 ET T: 06/29/2012 11:52:32 ET JOB#: [id number]  cc: Wardell Heath R. Rice Walsh, MD, <Dictator> Ginette Pitman. Dan Humphreys, MD Wardell Heath [place] Bali MD ELECTRONICALLY SIGNED 07/18/2012 11:07

## 2014-11-26 NOTE — Discharge Summary (Signed)
PATIENT NAME:  Wendy Sims, [name]:  [id number] DATE OF BIRTH:  [birth year]  DATE OF ADMISSION:  11/24/2012 DATE OF DISCHARGE:  11/27/2012  ADMITTING PHYSICIAN: Dr. Hilda LiasVivek Sims.  DISCHARGING PHYSICIAN: Dr. Enid Baasadhika Arias Sims   PRIMARY CARE PHYSICIAN: Dr. Ronna PolioJennifer Sims.  CONSULTATIONS IN THE HOSPITAL: None.   DISCHARGE DIAGNOSES:  1. Acute respiratory failure.  2. Acute diastolic congestive heart failure exacerbation, ejection fraction of 50%.  3. Asthma/chronic obstructive pulmonary disease exacerbation.  4. Chronic kidney disease.  5. Chronic atrial fibrillation.  6. Malignant hypertension.  7. Constipation.   DISCHARGE MEDICATIONS:  1. Albuterol nebulizer 3 mL twice a day as needed for wheezing and shortness of breath.  2. Fibertab 675 mg-two tablets once a day.  3. Vitamin B12 1000 mcg p.o. daily.  4. Albuterol inhaler two puffs q.6 hours p.r.n. for wheezing.  5. Lisinopril 10 mg p.o. daily.  6. Aspirin 81 mg p.o. daily.  7. Ambien 2.5 mg at bedtime p.r.n. for sleep.  8. Carvedilol 6.25 mg p.o. b.i.d.  9. Lasix 20 mg p.o. b.i.d. for 4 more days and then changed to p.o. daily.  10. Prednisone taper 60 mg p.o. daily and taper off x 10 mg daily.  11. Advair 100/50 one puff b.i.d.   DISCHARGE DIET: Low-sodium diet.   DISCHARGE ACTIVITY: As tolerated.   FOLLOWUP INSTRUCTIONS: PCP followup with Dr. Ronna PolioJennifer Sims in 1 to 2 weeks and also the patient will be discharged to assisted living facility.   LABS AND IMAGING STUDIES PRIOR TO DISCHARGE: 1. Urine analysis negative for any infection. 2. WBC 13.3, hemoglobin 12.7, hematocrit 38.4, platelet count 204.  3. Sodium 124, potassium 4.4, chloride 192, bicarb 28, BUN 34, creatinine 1.2, glucose 93 and calcium of 8.7.  4. Echo Doppler showing normal LV systolic function, EF is 50 to 55%, mild to moderate tricuspid regurgitation, severely elevated pulmonary arterial pressures.   BRIEF HOSPITAL COURSE: The patient is a  81 year old elderly Caucasian female with past medical history significant for asthma/COPD, diastolic CHF, chronic atrial fibrillation not on anticoagulation due to risk of fall. She was brought to the hospital secondary to difficulty breathing and was also noted to be hypoxic initially. The [facility] is from [facility].   1. Acute hypoxic respiratory failure secondary to CHF exacerbation. She has diastolic CHF based on echo. EF is 50 to 55%, diuresis with IV Lasix, changed over to p.o. Lasix. The patient already taking daily Lasix, but will be discharged on b.i.d. Lasix for four more days and then changed over to daily Lasix.  2. Asthma/COPD exacerbation. Started on prednisone taper Advair has been added with albuterol p.r.n. neb. She was requiring 2 to 3 liters oxygen and has been weaned off to room air. Her sats have been greater than 90% on room at this time.  3. Chronic atrial fibrillation, rate very well controlled on Coreg. She will continue that. Not on any other anticoagulation other than aspirin.  4. Hypertension. She is on Coreg and lisinopril which will continue at this time.  5. Chronic kidney disease, stable at this time.  6. Hyponatremia. Lower on admission, but clinically improving, so outpatient basic metabolic panel one week after discharge.  7. Her code status is DO NOT RESUSCITATE based on admission. 8. The patient worked with Physical Therapy, recommended assisted living, so the patient will be going back to [facility].   DISCHARGE CONDITION: Stable.   DISCHARGE DISPOSITION: To [facility].  TIME SPENT ON  DISCHARGE: 45 minutes.    ____________________________ Wendy Baas, MD rk:es D: 11/27/2012 14:57:09 ET T: 11/27/2012 15:40:12 ET JOB#: [id number]  cc: Wendy Baas, MD, <Dictator> Ginette Pitman. Dan Humphreys, MD Wendy Baas MD ELECTRONICALLY SIGNED 12/04/2012 16:00

## 2014-11-26 NOTE — Discharge Summary (Signed)
PATIENT NAME:  Wendy Sims, Wendy MR#:  [id number] DATE OF BIRTH:  [birth year]  PRIMARY CARE PHYSICIAN:  Dr. Ronna PolioJennifer Walker  DATE OF ADMISSION:  02/23/2013  DATE OF DISCHARGE:  02/28/2013  FINAL DIAGNOSES: 1.  Acute on chronic diastolic congestive heart failure with severe pulmonary hypertension.  2.  Hyperkalemia.  3.  Hyponatremia.  4.  Acute renal failure on chronic kidney disease stage III.  5.  Chronic atrial fibrillation   MEDICATIONS ON DISCHARGE INCLUDE:  Albuterol nebulizer 3 mL twice a day as needed for shortness of breath, vitamin B12, 1000 mcg daily, Bayer low dose 81 mg aspirin 1 tablet daily, furosemide 20 mg daily, Advair Diskus 150, 1 puff twice a day, Claritin 10 mg 1 tablet daily, acetaminophen 325 mg 2 tablets every 4 hours as needed for pain or temperature, Ambien 5 mg 1/2 tablet once a day at bedtime as needed for insomnia, Coreg 3.125 mg twice a day, fiber tabs 625 mg 1 tablet daily as needed.   INSTRUCTIONS:  Home health resumption of services with physical therapy and nurse aid.  Home oxygen:  None. Diet:  Low-sodium diet, regular consistency. Activity as tolerated.  Follow up in 1 to 2 weeks with Dr. Ronna PolioJennifer Walker.  REASON FOR ADMISSION:  The patient was admitted July 21 with shortness of breath for 3 to 4 days. Was found to be in acute on chronic congestive heart failure, and started on IV Lasix 20 mg IV b.i.d.   LABORATORY AND RADIOLOGICAL DATA DURING THE HOSPITAL COURSE: Included EKG that showed atrial fibrillation, low voltage. Chest x-ray showed no pneumonia, low-grade CHF. BNP is 7204. Troponin negative. Glucose 92, BUN 43, creatinine 1.38, sodium 127, potassium 5.5, chloride 98, CO2 of 24, calcium 9.4. White blood cell count 17.7, H and H  9.7 and 29.2, platelet count 168. Blood cultures negative. Urine culture grew out 70,000 Coag-negative staph. Protein electrophoresis showed a low albumin and low total protein. Creatinine on the 22nd went up to 1.78, on the  23rd when up to 2.21.  Ultrasound of the kidney:  No evidence of hydronephrosis, simple cyst left kidney, atrophy both kidneys. Creatinine improved, down to 1.17 upon discharge, sodium 132 upon discharge.  HOSPITAL COURSE PER PROBLEM LIST:  1.  For the patient's acute on chronic diastolic congestive heart failure with severe pulmonary hypertension on echo, the patient was initially diuresed with Lasix 20 mg IV b.i.d. Creatinine worsened, and Lasix needed to be held. Lasix can be restarted at 20 mg daily as an outpatient.  The patient's lisinopril was stopped secondary to acute on chronic kidney disease and hypotension. Lower dose Coreg was restarted as outpatient, 3.125 mg twice a day. Close clinical followup as outpatient in order to keep out of the hospital.   2.  Hyperkalemia. This was on presentation and, again, with the acute renal failure, this had improved upon discharge. Stopping the lisinopril will help, and continuing Lasix without potassium replacement should help.   3.  Chronic hyponatremia secondary to congestive heart failure.   4.  Acute renal failure on chronic kidney disease stage III. The patient will be on low dose Lasix as outpatient, holding on Ace inhibitor at this point.   5.  Chronic atrial fibrillation. The patient not on Coumadin or more anticoagulation secondary to fall risk. The patient is only on low-dose aspirin, Coreg for rate control.   Time spent on discharge:   35 minutes.    ____________________________ Herschell Dimesichard J. Renae GlossWieting, MD rjw:mr D: 02/28/2013 15:18:53 ET  T: 02/28/2013 22:44:48 ET JOB#: [id number]  cc: Herschell Dimes. Renae Gloss, MD, <Dictator> Ginette Pitman. Dan Humphreys, MD  Salley Scarlet MD ELECTRONICALLY SIGNED 03/02/2013 14:58

## 2014-11-26 NOTE — H&P (Signed)
PATIENT NAME:  Wendy Sims, SCOVILLE MR#:  [id number] DATE OF BIRTH:  Sep 07, 1908  DATE OF ADMISSION:  02/23/2013  PRIMARY CARE PHYSICIAN:  Ronna Polio, MD   CHIEF COMPLAINT: Shortness of breath for 3 to 4 days.   HISTORY OF PRESENT ILLNESS:  [name] is a very pleasant 81 year old Caucasian female with past medical history of chronic CHF, diastolic, with history of chronic A-fib who comes from [facility] after she was noted to have increasing shortness of breath and ankle edema. The patient's daughter was giving her some extra Lasix for the last couple of days, however, her symptoms worsened to the point she got fatigued with shortness of breath, brought here to the Emergency Room and was found to be in congestive heart failure, acute on chronic diastolic. Her A-fib is stable with heart rate in the 80s to 90s. She is being admitted for further evaluation and management. The patient received IV Lasix 40 mg. She also received empirically Levaquin dose for elevated white count. The patient denies any fever, chills or any productive cough. She denies any dysuria. Her urinalysis is still pending.   PAST MEDICAL HISTORY: 1.  Hypertension.  2.  Chronic A-fib, not on Coumadin. Takes baby aspirin.  3.  CHF, appears diastolic dysfunction with severe pulmonary hypertension by echo in April of 2014.  4.  Chronic kidney disease with creatinine around 1.1 to 1.3.  5.  Chronic hyponatremia, baseline sodium around 129 to 130.   SOCIAL HISTORY: Lives at [facility] assisted living. She uses a walker while walking. Does not smoke. Does not drink.   FAMILY HISTORY: From old records, mother and father died of old age. Father had history of MI.   MEDICATIONS: 1.  Zolpidem 2.5 mg at bedtime.  2.  Vitamin B12 1000 mcg once a day.  3.  Tylenol 500 mg 1 tablet b.i.d.  4.  Lisinopril 10 mg daily.  5.  Lasix 20 mg daily.  6.  Fiber Tab 625 mg p.o. daily.  7.  Claritin 10 mg daily.  8.  Carvedilol 6.25 mg b.i.d.   9.  Bayer aspirin 81 mg daily.  10.  Albuterol nebs 2 times a day.  11.  Advair 100/50 one puff b.i.d.   ALLERGIES: No known drug allergies.   REVIEW OF SYSTEMS: CONSTITUTIONAL: Positive for shortness of breath and leg swelling. No fever. Positive for fatigue and weakness.  EYES: No blurred or double vision, glaucoma or cataracts.  ENT: No tinnitus, ear pain, hearing loss or postnasal drip.  RESPIRATORY: Positive for shortness of breath and dyspnea on exertion. No pneumonia, cough or painful respiration.  CARDIOVASCULAR: No chest pain. Positive for orthopnea and edema. Positive for dyspnea on exertion.  GASTROINTESTINAL: No nausea, vomiting, diarrhea or abdominal pain. No GERD.  GENITOURINARY: No dysuria or hematuria.  ENDOCRINE: No polyuria, nocturia or thyroid problems.  HEMATOLOGY: No anemia or easy bruising.  SKIN: No acne or rash.  MUSCULOSKELETAL: Positive for arthritis and back pain.  NEUROLOGIC: No CVA, TIA, dysarthria or epilepsy.  PSYCH:  No anxiety or depression. All other systems reviewed and negative.  LABORATORY AND DIAGNOSTICS:  EKG shows chronic A-fib.   Glucose is 109, BUN 43, creatinine 1.59 and sodium is 126. Potassium is 5.0 and chloride 97. White count is 17.7, H and H is 9.7 and 29.2 and platelet count is 168. Chest x-ray is consistent with low-grade compensated CHF. No focal pneumonia noted. Troponin is 0.02.   ASSESSMENT AND PLAN: 81 year old Ms. [name] with  history of chronic diastolic heart failure and chronic atrial fibrillation who comes in with increasing shortness of breath and leg edema for the last 3 to 4 days. She is being admitted with:  1.  Acute on chronic congestive heart failure, diastolic. The patient failed outpatient treatment with extra doses of Lasix for the last 2 to 3 days which daughter was trying to give her to help with diuresis. The patient will be admitted on telemetry floor, 2 grams sodium diet. Will continue intravenous Lasix  20 mg b.i.d. Watch creatinine. Watch ins and outs. The patient does have severe pulmonary hypertension by echo, hence, we will diurese her very cautiously. She has relative low blood pressure today. Echo was already done in April of 2014 which showed ejection fraction of 50% to 55% with severe pulmonary hypertension.  I will not repeat during this admission.  We will cycle cardiac enzymes x 3 and continue low dose Coreg as blood pressure allows.  2.  Acute on chronic hyponatremia suspected due to volume overload with congestive heart failure. Baseline sodium is around 130. Monitor Basic metabolic panel daily.  3.  Chronic atrial fibrillation. On aspirin. Not a candidate for Coumadin with high fall risk. 4.  Leukocytosis. No source of infection yet found. Received empiric Levaquin in the Emergency Room. I have sent out urinalysis, which is still pending. I will hold off on antibiotics for now. The patient is asymptomatic and afebrile.  5.  Renal failure, acute on chronic, baseline creatinine around 1.1 to 1.3.  Hold lisinopril and monitor creatinine closely.  6.  Deep vein thrombosis prophylaxis with subcu Lovenox.   CODE STATUS: NO CODE, DNR.  This was reconfirmed with the patient's daughter and healthcare power of attorney. [facility].   TIME SPENT: 55 minutes. ____________________________ Wylie HailSona A. Allena KatzPatel, MD sap:sb D: 02/23/2013 15:24:49 ET T: 02/23/2013 16:03:56 ET JOB#: [id number]  cc: Safiyyah Vasconez A. Allena KatzPatel, MD, <Dictator> Ginette PitmanJennifer A. Dan HumphreysWalker, MD Willow OraSONA A Jaxston Chohan MD ELECTRONICALLY SIGNED 02/23/2013 19:10

## 2014-11-26 NOTE — Discharge Summary (Signed)
PATIENT NAME:  Wendy Sims, Wendy Sims DATE OF BIRTH:  1909-01-24  DATE OF ADMISSION:  12/05/2012 DATE OF DISCHARGE:  12/06/2012  PRESENTING COMPLAINT: Nausea, vomiting and diarrhea.   DISCHARGE DIAGNOSES: 1.  Acute gastroenteritis, resolved.  2.  Dehydration, clinical, clinically resolved.  3.  Relative hypertension.  4.  History of congestive heart failure and chronic obstructive pulmonary disease, stable.   CODE STATUS: NO CODE/DNR.   The patient is being discharged to [facility].   MEDICATIONS: 1.  Albuterol nebs, twice a day as needed.  2.  Vitamin B12 1000 mcg daily.  3.  Bayer aspirin 81 mg daily.  4.  Ambien 5 mg one-half tablet daily at bedtime.  5.  Fiber-Tabs 625 mg 1 tablet daily.  6.  Lasix 20 mg daily.  7.  Advair 100/50, 1 puff b.i.d.  8.  Lisinopril, 10 mg daily.  9.  Carvedilol 6.25 mg b.i.d.   Blood pressure medications to be resumed once systolic blood pressure is greater than 110.   DIET: Regular.   FOLLOWUP: With Dr. Ronna PolioJennifer Walker in 1 to 2 weeks.   White count 9.9, H and H  is 10.9 and 32.5, platelet count 190.   Basic metabolic panel: Glucose is 84. BUN 58, creatinine 1.4. Sodium 135.   Abdominal x-ray shows nonspecific bowel appearance. Loops mildly distended, gas-filled, with small to the right of midline.   UA negative for UTI.   LFTs within normal limits.   BRIEF SUMMARY OF HOSPITAL COURSE: The patient is a 81 year old Caucasian female who came to the Emergency Room from [facility] after she started having some diarrhea associated with nausea and an episode of vomiting. Several other residents at this facility were thought to be having similar symptoms. She was admitted with:   1.  Nausea, vomiting, and diarrhea: Suspected due to acute gastroenteritis. She was kept n.p.o. initially. Started on IV fluids. The patient did not have any more diarrhea. Not able to get any sample for C. diff. She remained afebrile. IV fluids were  discontinued once the patient received a couple of liters. She appeared euvolemic. Started on a soft diet, which she tolerated prior to discharge.  2.  Leukocytosis: Appears reactive. White count at discharge was normal.  3. History of CHF and COPD: Remained stable.  4. Hypertension: The patient had relative hypotension and hence BP meds will be resumed once systolic blood pressures is greater than 110.  5.  CODE STATUS: The patient remained a NO CODE/DNR.   Discharged planning was discussed with the patient's daughter, who was agreeable to it.   Time spent: Forty minutes.    ____________________________ Wylie HailSona A. Allena KatzPatel, MD sap:dm D: 12/07/2012 07:06:04 ET T: 12/07/2012 13:32:41 ET JOB#: [id number]  cc: Jhovanny Guinta A. Allena KatzPatel, MD, <Dictator> Ginette PitmanJennifer A. Dan HumphreysWalker, MD Willow OraSONA A Christophr Calix MD ELECTRONICALLY SIGNED 12/23/2012 15:20

## 2014-11-26 NOTE — H&P (Signed)
PATIENT NAME:  Wendy Sims, Wendy Sims MR#:  [id number] DATE OF BIRTH:  1909-07-07  DATE OF ADMISSION:  12/05/2012  PRIMARY CARE PHYSICIAN: Victorino DikeJennifer A. Dan HumphreysWalker, MD  EMERGENCY DEPARTMENT REFERRING PHYSICIAN: Cecille AmsterdamJonathan E. Mayford KnifeWilliams, MD  CHIEF COMPLAINT: Nausea, vomiting, diarrhea since yesterday.   HISTORY OF PRESENT ILLNESS: The patient is a 81 year old white female with previous history of hypertension, history of atrial fibrillation, history of CHF, chronic hyponatremia, who presents with having nausea and vomiting and diarrhea since yesterday. The patient has had multiple episodes of having diarrhea. There are other residents at the facility that have had similar symptoms according to the daughter. The patient has not been on any recent antibiotics. Denies any abdominal pain currently. She has not had any fevers or chills. She denies any chest pain. She has chronic shortness of breath which is unchanged.   PAST MEDICAL HISTORY:  1.  Hypertension.  2.  History of atrial fibrillation. 3.  History of CHF with echo that showed diastolic dysfunction. Also during her previous hospitalization, she was diagnosed with possible asthma/COPD.  4.  History of chronic kidney disease.  5.  Chronic hyponatremia.   ALLERGIES: To no medications.   CURRENT MEDICATIONS: She is on Advair Diskus 100/50 one puff b.i.d.. albuterol nebs 2 times a day as needed, Bayer aspirin 81 one tab p.o. daily, carvedilol 6.25 one tab p.o. b.i.d., Fibertab 625 one tab p.o. daily, Lasix 20 one tab p.o. daily, lisinopril 10 daily, vitamin B12 at 1000 mcg daily, Ambien 1/2 tab at bedtime.   SOCIAL HISTORY: Does not smoke. Does not drink. Lives at [facility].   FAMILY HISTORY: Both mother and father died of an old age. Father died of an MI.   REVIEW OF SYSTEMS:    CONSTITUTIONAL: Denies any fevers. Complains of some fatigue, weakness. No pain. No weight loss. No weight gain.  EYES: No blurred or double vision. No pain. No  redness. No inflammation.  EARS, NOSE, THROAT: No tinnitus. No ear pain. No hearing loss. No seasonal or year-round allergies. No difficulty swallowing.  RESPIRATORY: Denies any cough, wheezing, hemoptysis. Does have history of possible COPD and asthma.  CARDIOVASCULAR: Denies any chest pain, orthopnea or edema. Does have history of CHF and A. fib.  GASTROINTESTINAL: Complains of nausea, vomiting, diarrhea as above. No abdominal pain. No hematemesis. No melena.  GENITOURINARY: Denies any dysuria, hematuria, renal calculus or frequency.  ENDOCRINE: Denies any polyuria, nocturia or thyroid problems.  HEMATOLOGIC AND LYMPHATIC: Denies anemia, easy bruisability or bleeding.  MUSCULOSKELETAL: Has pain related to osteoarthritis.  NEUROLOGICAL: No numbness. No CVA. No TIA or seizures.  PSYCHIATRIC: No anxiety. No insomnia. No ADD.   PHYSICAL EXAMINATION:  VITAL SIGNS: Temperature 97.5, pulse 101, respirations 16, blood pressure 98/53.  GENERAL: The patient is a fragile elderly female, currently not in acute distress.  HEENT: Head atraumatic, normocephalic. Pupils equally round, reactive to light and accommodation. There is no conjunctival pallor. No scleral icterus.  Ears: no erythema or draiange noted NECK: Supple. No JVD. No carotid bruits. No lymphadenopathy.  HEART: Irregular rhythm. No murmurs, rubs, clicks or gallops. PMI is not displaced.  LUNGS: Clear to auscultation bilaterally without any rales, rhonchi or wheezing.  ABDOMEN: Soft, nontender, nondistended. Positive bowel sounds x 4. No hepatosplenomegaly.  GU deffered EXTREMITIES: No clubbing, cyanosis or edema.  SKIN: There is no rash.  Muskuloskeletal : no ertyjema or swelling noted LYMPHATIC: No lymph nodes palpable.  NEUROLOGICAL: The patient is awake, alert, oriented x 3 with no focal  deficits.  PSYCHIATRIC: Not anxious or depressed.   LABORATORY AND RADIOLOGICAL DATA: In the ED, abdominal x-ray shows appearance of heart and  pulmonary vascularity and  suggests possible congestive heart failure. Nonspecific appearance of the bowel loops. Mildly distended gas-filled small bowel. Urinalysis: Nitrites negative, leukocytes negative. WBC 20.0, hemoglobin 13.5, platelet count 264. BMP: Glucose 106, BUN 61, creatinine 1.16, sodium 129, potassium 5.4, chloride 98, CO2 of 26. LFTs were normal. Troponin less than 0.02.   ASSESSMENT AND PLAN: The patient is a 81 year old white female who presents with nausea, vomiting, diarrhea.  1.  Nausea, vomiting, diarrhea: Likely due to acute gastroenteritis. At this time will provide her with supportive care. Will obtain stool studies with Clostridium difficile and stool cultures. Will observe overnight.  2.  Leukocytosis: Likely reactive. Will follow her CBC in the morning. If she develops fever, we will panculture her.  3.  History of congestive heart failure: Will give her fluids cautiously. Monitor ins and outs. Chest x-ray findings are likely chronic.  4.  Hypertension: Intermittently low blood pressure. Will hold her antihypertensives.  5.  Asthma/chronic obstructive pulmonary disease: Stable. Will continue inhalers.  6.  Code status: DO NOT RESUSCITATE, confirmed with patient and her daughter   TIME SPENT: 50 minutes spent on H and P.   ____________________________ Lacie Scotts. Allena Katz, MD shp:jm D: 12/05/2012 14:02:12 ET T: 12/05/2012 14:53:24 ET JOB#: [id number]  cc: Greenlee Ancheta H. Allena Katz, MD, <Dictator> Charise Carwin MD ELECTRONICALLY SIGNED 12/13/2012 14:26

## 2014-11-26 NOTE — H&P (Signed)
PATIENT NAME:  Wendy Sims, Wendy Sims DATE OF BIRTH:  1909-04-23  DATE OF ADMISSION:  11/24/2012  PRIMARY CARE PHYSICIAN: Dr. Ronna PolioJennifer Walker   CHIEF COMPLAINT: Shortness of breath.   HISTORY OF PRESENT ILLNESS: This is 81 year old female who presents to the hospital with shortness of breath ongoing for the past 2 to 3 days. The patient resides at assisted living at [facility] and was brought in by her family as she has had worsening dyspnea over the past couple of days. She also has a cough productive with clear sputum. Her dyspnea is not only at exertion but also at rest.  They have also heard some audible wheezing from the patient. The patient denies any paroxysmal nocturnal dyspnea, though, or any orthopnea, any palpitations, any syncope, any chest pain, any nausea, vomiting, abdominal pain, or any other associated symptoms. The patient presented to the hospital and was noted to be hypoxic with room air O2  saturations to in the high 80s. The chest x-ray findings are suggestive of interstitial edema and congestive heart failure. Hospitalist services were contacted for further treatment and evaluation.   REVIEW OF SYSTEMS:  CONSTITUTIONAL: No documented fever. No weight gain, no weight loss.  EYES: No blurry or double vision.  ENT: No tinnitus. No postnasal drip. No redness of the oropharynx.  RESPIRATORY: Positive cough, no wheeze, no hemoptysis. Positive dyspnea.  CARDIOVASCULAR: No chest pain, no orthopnea, no palpitations, no syncope.  GASTROINTESTINAL: No nausea, no vomiting, no diarrhea, no abdominal pain, no melena or hematochezia.  GENITOURINARY: No dysuria, no hematuria.  ENDOCRINE: No polyuria or nocturia, heat or cold intolerance.  HEMATOLOGIC: No anemia, no bruising, no bleeding.  INTEGUMENTARY: No rashes. No lesions.  MUSCULOSKELETAL: No crisis, no swelling, no gout.  NEUROLOGIC: No numbness or tingling. No ataxia. No seizure-type activity.  PSYCHIATRIC: No anxiety.  No insomnia. No ADD.   PAST MEDICAL HISTORY: Consistent with hypertension, chronic a-fib, history of CHF.   ALLERGIES: No known drug allergies.   SOCIAL HISTORY: No smoking. No alcohol abuse. No illicit drug abuse. She lives at [facility] assisted [facility].   FAMILY HISTORY: Both mother and father died at an old age. Father did have an MI.   CURRENT MEDICATIONS: Albuterol inhaler t.i.d. or b.i.d. as needed for shortness of breath, aspirin 81 mg daily, Coreg 6.25 mg b.i.d., Fibertab 625 mg 2 tabs daily, Lasix 20 mg daily, lisinopril 10 mg daily, vitamin B12 1 tab daily, Ambien 5 mg 1/2 tab at bedtime.   PHYSICAL EXAMINATION:  VITAL SIGNS: On admission, temperature is 97.5, pulse 103, respirations 22, blood pressure 221/101 with oxygen saturation at 94% on 2 liters nasal cannula.  GENERAL: She is a pleasant-appearing female in no apparent distress.  HEENT: Atraumatic, normocephalic. Extraocular muscles are intact. Pupils are equal and reactive to light. Sclerae are anicteric. No conjunctival injection. No pharyngeal erythema.  NECK: Supple. No jugular venous distention, no bruits, no lymphadenopathy, no thyromegaly.  HEART: Irregular. No murmurs, no rubs, no clicks.  LUNGS: She has coarse crackles diffusely from the lung bases to the mid lung fields. Negative use of accessory muscles. No dullness to percussion.  No rhonchi. No wheezes.  ABDOMEN: Soft, flat, nontender, nondistended. Has good bowel sounds. No hepatosplenomegaly appreciated.  EXTREMITIES: No evidence of any cyanosis, clubbing or peripheral edema. Has +2 pedal and radial pulses bilaterally.  NEUROLOGICAL: The patient is alert, awake, and oriented x 3 with no focal motor or sensory deficits appreciated bilaterally.  SKIN: Moist and warm  with no rash appreciated.  LYMPHATIC: There is no cervical or axillary lymphadenopathy.   LABORATORY AND RADIOLOGICAL DATA:  Serum glucose of 98, BNP of 4572, BUN 32, creatinine 1.2, sodium 128,  potassium 4.7, chloride 94, bicarbonate 27. LFTs are within normal limits. Troponin less than 0.02. White cell count 13.2, hemoglobin 12.7, hematocrit 38.4, platelet count of 204. INR is 1.1 .   The patient did have a chest x-ray done which showed interstitial pulmonary opacities similar to prior. Differential would include chronic interstitial lung disease as well as interstitial edema.   ASSESSMENT AND PLAN: This is a 81 year old female with history of chronic atrial fibrillation, hypertension, history of congestive heart failure who presents to the hospital with shortness of breath and also malignant hypertension, noted to be in congestive heart failure.   1. Acute congestive heart failure: Likely cause of the patient's shortness of breath. I am not sure if this is systolic or diastolic heart failure as we do not have a recent echocardiogram. I will go ahead and diuresis her with IV Lasix, follow I's and O's and daily weights. Continue her Coreg, continue her ACE inhibitor.  We will get a 2-dimensional echocardiogram.  2. Malignant hypertension: The patient's systolic blood pressures are over 200s here. I will start her on her lisinopril and her Coreg.  We will also add some p.r.n. labetalol or hydralazine, follow hemodynamics. Some of this could be white coat hypertension related to anxiety and she gets worked up when she comes to the hospital.  3. Chronic atrial fibrillation, currently rate controlled: Continue with her Coreg. She will continue her aspirin. She is not on long-term anticoagulation given her advanced age and high fall.  4. Hyponatremia:  Seems chronic in nature. It was 128 also in December of last year, probably related to underlying congestive heart failure. We will diuresis with Lasix and follow sodium for now.   CODE STATUS: The patient is a DNI/DNR.   TIME SPENT: 50 minutes.    ____________________________ Rolly Pancake. Cherlynn Kaiser, MD vjs:cb D: 11/24/2012 19:45:33 ET T: 11/24/2012  20:03:58 ET JOB#: [id number]  cc: Rolly Pancake. Cherlynn Kaiser, MD, <Dictator> Houston Siren MD ELECTRONICALLY SIGNED 11/29/2012 14:48

## 2014-11-26 NOTE — Consult Note (Signed)
PATIENT NAME:  Wendy Sims, Wendy Sims MR#:  [id number] DATE OF BIRTH:  June 14, 1909  DATE OF CONSULTATION:  02/25/2013  REFERRING PHYSICIAN:   CONSULTING PHYSICIAN:  [employer] Lilian Kapur, MD  REASON FOR CONSULTATION:  Acute renal failure, chronic kidney disease stage 3.   HISTORY OF PRESENT ILLNESS: The patient is a very pleasant 81 year old Caucasian female with past medical history of hypertension, chronic atrial fibrillation, diastolic heart failure with severe pulmonary hypertension, chronic kidney disease stage 3 with baseline creatinine 1.1, chronic hyponatremia with serum sodium ranging from 129 to 130, who presented to [facility] with progressive shortness of breath. She was admitted to [facility] on February 23, 2013. The patient was having dyspnea with exertion as well as increasing lower extremity edema. The patient currently resides at an independent living facility. The patient was started on furosemide 20 mg IV b.i.d. and was then transitioned to 20 mg daily; however, this has been held at present. There have been some documented periods of hypotension. Creatinine now is up to 2.21. Upon presentation, the creatinine was found to be 1.38. Serum sodium is also slightly lower at 124. Her lower extremity edema is much improved. She states that at rest, currently, she is not having any shortness of breath. She had a urinalysis which was negative for proteinuria. She had 4 RBCs per high-power field. Previously, she was found to have a normal ejection fraction but is suspected of having diastolic dysfunction and also has severe underlying pulmonary hypertension.   PAST MEDICAL HISTORY 1.  Hypertension.  2.  Chronic atrial fibrillation, on baby aspirin.  3.  Chronic diastolic heart failure with severe pulmonary hypertension.  4.  Chronic kidney disease stage 3, baseline creatinine 1.1 with an EGFR of 38.  5.  Chronic hyponatremia.   ALLERGIES: No known drug  allergies.   CURRENT INPATIENT MEDICATIONS: Include:  Tylenol 650 mg p.o. every 4 hours p.r.n., aspirin 81 mg daily, docusate 100 mg p.o. b.i.d., Advair Diskus 100/50, 1 puff inhaled b.i.d.; heparin 5000 units subcutaneous q.12 hours, Zofran 4 mg IV every 4 hours p.r.n., senna 1 tablet p.o. b.i.d., Ambien 2.5 mg p.o. at bedtime, Lasix 20 mg IV daily which is currently on hold   SOCIAL HISTORY: The patient resides at an independent living facility called [facility]. She was ambulating with a walker at the facility. She denies tobacco use. She occasionally drinks wine. No illicit drug use.    FAMILY HISTORY: Mother and father are both deceased. Father had history of myocardial infarction. Mother apparently died at age 13.   REVIEW OF SYSTEMS CONSTITUTIONAL: Denies fevers, chills or weight loss.  EYES: Denies diplopia, blurry vision.  HEENT: Denies headaches, hearing loss. Denies epistaxis.  CARDIOVASCULAR: Denies chest pains, does have a history of atrial fibrillation, endorses dyspnea with exertion.  RESPIRATORY: Denies cough or hemoptysis. Does have shortness of breath with exertion.  GASTROINTESTINAL: Denies nausea, vomiting, diarrhea.  GENITOURINARY: Denies frequency, urgency or dysuria.  MUSCULOSKELETAL: Denies joint pain, swelling or redness.  INTEGUMENTARY: Denies skin rashes or lesions.  NEUROLOGIC: Denies focal extremity numbness or weakness.  PSYCHIATRIC: Denies depression and bipolar disorder.  ENDOCRINE: Denies polyuria or polydipsia.  HEMATOLOGIC AND LYMPHATIC: Denies easy bruisability, bleeding or swollen lymph nodes.  ALLERGY AND IMMUNOLOGIC: Denies seasonal allergies or history of immunodeficiency.   PHYSICAL EXAMINATION VITAL SIGNS: Temperature 97.9, pulse 76, respirations 18, blood pressure 91/60 and pulse ox 98% on 2 liters.  GENERAL: Elderly, frail Caucasian female currently in no acute distress.  HEENT: Normocephalic, atraumatic. Extraocular movements are intact. Pupils  equal, round and reactive to light. No scleral icterus. Conjunctivae are pink. No epistaxis noted. Gross hearing is slightly diminished. Oral mucosa moist.  NECK: Supple without JVD or lymphadenopathy.  LUNGS: Basilar rales, otherwise normal respiratory effort.  HEART: S1, S2 noted to be irregular, 2 out of 6 systolic ejection murmur heard.  ABDOMEN: Soft, nontender, nondistended. Bowel sounds positive. No rebound, guarding. No gross organomegaly appreciated.  EXTREMITIES: No clubbing or cyanosis noted. Trace bilateral lower extremity edema noted.  NEUROLOGIC: The patient is awake, alert and oriented to time, person and place. Strength is 5 out of 5 in both upper and lower extremities  SKIN: Warm and dry, normal skin turgor noted.  GU: No suprapubic tenderness is noted at this time.  MUSCULOSKELETAL: No joint redness, swelling or tenderness appreciated.  PSYCHIATRIC: The patient with appropriate affect and appears to have some insight into her current illness.   LABORATORY DATA: Sodium 124, potassium 4.7, chloride 94, CO2 24, BUN 67, creatinine 2.2, glucose 83. CBC shows WBC 17.7, hemoglobin 9.7, hematocrit 29, platelets 168. Urine culture shows 70,000 colony-forming units of gram-positive cocci. UA shows 4 RBCs per high-power field, 2 WBCs per high-power field, no bacteria, negative for protein.   IMPRESSION: This is a 81 year old Caucasian female with past medical history of hypertension, chronic atrial fibrillation, chronic diastolic heart failure with severe pulmonary hypertension, chronic kidney disease stage 3, baseline creatinine 1.1 with an EGFR of 38, chronic hyponatremia who presented to [facility] with worsening shortness of breath and felt to have diastolic heart failure exacerbation.  1.  Acute renal failure/chronic kidney disease, stage 3. The patient certainly does appear to have acute renal failure now. Creatinine is up to 2.2. She was receiving IV diuretic  therapy. Serum sodium has also dropped to 124. She may have experienced a slight bit of overdiuresis. Lasix has been held. We will provide very gentle hydration with 0.9 normal saline at 40 mL/h just for 500 mL to see if we can improve her renal perfusion a bit. We will also obtain a renal ultrasound for further evaluation. We will also check serum protein electrophoresis and urine protein electrophoresis. We would certainly avoid any further nephrotoxins including IV contrast as well as nonsteroidal anti-inflammatory drugs. 2.  Hypotension. Likely contributing to the acute renal failure. It appears that carvedilol has been placed on hold and we agree with this. Continue to monitor blood pressure curve.  3.  Acute diastolic heart failure exacerbation. The patient was started on Lasix but this has been held. There may have been slight overdiuresis.  4.  Anemia, not otherwise specified. Hemoglobin noted to be 9.7 and MCV is 87. She may have an element of anemia of chronic kidney disease. No urgent indication for Procrit at present; however, we will likely need to follow CBC over time.   I would like to thank Dr. Bridgett Larsson for this kind referral. Further plan as the patient progresses.  ____________________________ Tama High, MD mnl:cs D: 02/25/2013 14:39:32 ET T: 02/25/2013 14:54:30 ET JOB#: [id number]  cc: Tama High, MD, <Dictator> Mariah Milling Lamaria Hildebrandt MD ELECTRONICALLY SIGNED 03/09/2013 11:21

## 2014-12-01 ENCOUNTER — Other Ambulatory Visit: Payer: Self-pay | Admitting: Internal Medicine

## 2014-12-05 NOTE — H&P (Signed)
PATIENT NAME:  Wendy Sims, Wendy Sims MR#:  [id number] DATE OF BIRTH:  07/13/1909  DATE OF ADMISSION:  10/03/2014  PRIMARY CARE PHYSICIAN:  Victorino DikeJennifer A. Dan HumphreysWalker, MD  CHIEF COMPLAINT: Shortness of breath today.   HISTORY OF PRESENT ILLNESS: A 81 year old Caucasian female with a history of CHF, atrial fibrillation, and CKD, who presented to the ED with above chief complaint. The patient is alert, awake, oriented, in no acute distress. According to the patient and the patient's daughter, the patient is living in assisted living, has chronic shortness of breath, but the shortness of breath is worsened today, so the patient was sent to the ED for further evaluation. The patient is hard of hearing. She denies any chest pain, palpitation, orthopnea, or nocturnal dyspnea. The patient denies any other symptoms.   The patient's chest x-ray showed pulmonary edema, was treated with 1 dose of Lasix 20 mg in ED.    PAST MEDICAL HISTORY: Hypertension, chronic atrial fibrillation, not on Coumadin, chronic diastolic CHF with severe pulmonary hypertension, CKD with creatinine around 1.1-1.3, chronic hyponatremia.   SOCIAL HISTORY: Lives at [facility]. Uses walker. No smoking or drinking or illicit drugs.   FAMILY HISTORY: Father died of old age, mother died of old age. Father had a history of MI.    ALLERGIES: None.   HOME MEDICATIONS: Zolpidem 5 mg 1 tablets once a day, B12, 1000 mcg p.o. daily, Lasix 20 mg p.o. daily, Fibertab  625 mg p.o. once a day p.r.n., Claritin 10 mg p.o. daily, Coreg 3.125 mg p.o. b.i.d., Bayer aspirin 81 mg p.o. daily, Advair 100 mcg/50 mcg 1 puff twice a day, acetaminophen 500 mg p.o. once a day in the morning and 2 tablets once a day in the evening.  REVIEW OF SYSTEMS:  CONSTITUTIONAL: The patient denies any fever or chills. No headache or dizziness or weakness.  EYES: No double vision or blurred vision.  EARS, NOSE, AND THROAT: No postnasal drip, slurred speech, or  dysphagia.  CARDIOVASCULAR: No chest pain, palpitation, orthopnea, or nocturnal dyspnea. No leg edema.  PULMONARY: Positive for cough, sputum, shortness of breath. No hemoptysis. No wheezing.  GASTROINTESTINAL: No abdominal pain, nausea, vomiting, diarrhea. No melena or bloody stool. GENITOURINARY:  No dysuria, hematuria, or incontinence.  SKIN: No rash or jaundice.  NEUROLOGY: No syncope, loss of consciousness, or seizure.  HEMATOLOGIC: No easy bruising or bleeding.  ENDOCRINE: No polyuria, polydipsia, heat or cold intolerance.   PHYSICAL EXAMINATION: VITAL SIGNS: Temperature 97.7, blood pressure 169/102, pulse 100, O2 saturation 100% on room air.  GENERAL: The patient is alert, awake, oriented, in no acute distress.  HEENT: Pupils round, equally reactive to light and accommodation. Moist oral mucosa. Clear oropharynx.  NECK: Supple. No JVD or carotid bruit. No lymphadenopathy. No thyromegaly,  CARDIOVASCULAR: S1, S2, regular rate and rhythm. No murmurs or gallops.  PULMONARY: Bilateral air entry. No wheezing, but has bilateral basilar rales. No use of accessory muscles to breathe.  ABDOMEN: Soft. No distention or tenderness. No organomegaly. Bowel sounds present.  EXTREMITIES: No edema, clubbing, or cyanosis. No calf tenderness. Bilateral pedal pulses present.  SKIN: No rash or jaundice.  NEUROLOGY: Alert and oriented x 3. No focal deficits. Power 4/5. Sensory intact.   IMAGING:  Chest x-ray shows airway thickening suggesting bronchitis or reactive airway disease, mild cardiomegaly, equivocal blunting of the left lateral costophrenic.   LABORATORY DATA:  Urinalysis is negative. WBC 11.4, hemoglobin 12.1, platelets 155,000. Troponin 0.22, glucose 110, BUN 33, creatinine 1.3. Electrolytes normal.  BNP 4463.   EKG showed atrial fibrillation at 98 BPM.   IMPRESSIONS: 1.  Acute on chronic diastolic congestive heart failure.  2.  Elevated troponin due to demand ischemia.  3.  Chronic atrial  fibrillation.  4.  Chronic kidney disease with dehydration.  5.  Hypertension.   PLAN OF TREATMENT: 1.  The patient will be admitted to medical floor with a telemonitor and we will start CHF protocol, give Lasix 20 mg IV b.i.d. and continue patient's hypertension medication.  2.  Give DuoNeb and follow up a troponin level, give aspirin.   I discussed the patient's condition and plan of treatment with the patient and the patient's daughter.   TIME SPENT: About 53 minutes.   Patient's code status is DO NOT RESUSCITATE.   ____________________________ Shaune Pollack, MD qc:LT D: 10/03/2014 19:43:40 ET T: 10/03/2014 20:01:38 ET JOB#: [id number]  cc: Shaune Pollack, MD, <Dictator> Shaune Pollack MD ELECTRONICALLY SIGNED 10/04/2014 17:46

## 2014-12-05 NOTE — Discharge Summary (Signed)
PATIENT NAME:  Wendy Sims, Wendy Sims MR#:  [id number] DATE OF BIRTH:  12/16/1908  DATE OF ADMISSION:  10/03/2014 DATE OF DISCHARGE:  10/04/2014  PRESENTING COMPLAINT: Shortness of breath.   DISCHARGE DIAGNOSES: 1.  Acute on chronic diastolic congestive heart failure.  2.  Hypertension.  3.  Chronic kidney disease, stage III.  4.  Elevated troponin due to demand ischemia.  5.  Chronic atrial fibrillation.  6.  Hypertension.  7.  Severe pulmonary hypertension.  8.  Chronic hyponatremia.   CONSULTATIONS: Dr. Harriett SineNancy Phifer, palliative care.   PROCEDURES: Chest x-ray, February 28th, shows airway thickening present, suggestive of bronchitis or reactive airway disease. Mild cardiomegaly without edema. Equivocal blunting of left lateral costophrenic angle, difficult to exclude very small left plural effusion. Advanced bilateral glenohumeral arthropathy.   HISTORY OF PRESENT ILLNESS: This 81 year old female with history of congestive heart failure, atrial fibrillation and chronic kidney disease presents to the Emergency Room with shortness of breath. On presentation, she was awake, alert and oriented, in no distress. According to the patient and her daughter, she lives in assisted living and has chronic shortness of breath which worsened acutely today. She was sent to the Emergency Room by her facility. She denies chest pain, palpitation, orthopnea, or nocturnal dyspnea. X-ray shows possible pulmonary edema. She has been treated with 1 dose of Lasix in the Emergency Room and admitted for further evaluation.   HOSPITAL COURSE BY PROBLEM:  1.  Acute on chronic diastolic congestive heart failure exacerbation: Possible edema and very small pleural effusion seen on chest x-ray in the Emergency Room. She was treated with IV Lasix on admission and her shortness of breath improved. At the time of discharge, she is no longer short of breath, breathing very comfortably, and oxygen saturations are robust on room air.   2.  Hypertension: Blood pressure is well controlled. No changes made to her home regimen.  3.  Code status: DNR. Palliative care was consulted during this admission and a hospice screen was placed so that the patient can receive hospice services as an outpatient at her assisted living facility.  4.  Elevated troponins: There are most likely due to strain. I did discuss possibility of cardiology consultation with the family and the patient. It was agreed that this would not likely be beneficial as she would not be a candidate for further intervention or treatment. Troponins did trend down by the time of discharge. No further evaluation is planned.   DISCHARGE PHYSICAL EXAMINATION: VITAL SIGNS: Temperature 98.1, pulse 89, respirations 18, blood pressure 160/90, oxygenation 93% on room air.  GENERAL: No acute distress.  CARDIOVASCULAR: Irregular, slightly tachycardic. There is a systolic ejection murmur. No peripheral edema. Peripheral pulses 1+.  RESPIRATORY: There are bibasilar fine crackles. Good air movement. No respiratory distress.  ABDOMEN: Soft, nontender, nondistended. No guarding. No rebound. No hepatosplenomegaly. Bowel sounds are normal.  PSYCHIATRIC: The patient is alert, fairly oriented with good insight into her clinical condition, and she is eager for discharge.   DIAGNOSTIC DATA: Sodium 137, potassium 4, chloride 103, bicarb 26, BUN 32, creatinine 1.18, glucose 90. Troponins 0.22, 0.49, 0.43. CK-MB normal throughout. White blood cells 9.2, hemoglobin 11.4, platelets 153,000, MCV 88. UA negative for signs of infection.   DISCHARGE MEDICATIONS: 1.  Aspirin 81 mg daily.  2.  Advair Diskus 100 mcg/50 mcg 1 puff twice a day.  3.  Zolpidem 5 mg 1/2 tablet once a day at bedtime as needed for insomnia.  4.  Carvedilol 3.125  mg 1 tablet twice a day.  5.  Acetaminophen 500 mg 1 tablet once a day in the morning and 2 tablets in the evening. 6.  Claritin 10 mg 1 capsule daily.  7.  Vitamin  B12 1000 mcg 1 tablet daily.  8.  Fiber tabs 625 mg oral tablet 1 tablet once a day as needed for constipation.  9.  Furosemide 20 mg 1 tablet twice a day.  10.  Nitroglycerin 0.4 mg sublingual tablet 1 tablet every 5 minutes as needed for chest pain.  CONDITION ON DISCHARGE: Stable.   DISPOSITION: The patient is being returned to her assisted living facility with hospice screen for hospice services at home.   DISCHARGE DIET: Low-sodium, low-fat, low-cholesterol DIET.  DISCHARGE ACTIVITY: No restrictions.   TIMEFRAME FOR FOLLOW-UP: Follow up within 1 to 2 weeks with your primary care provider.   TIME SPENT ON DISCHARGE: 40 minutes. ____________________________ Ena Dawley. Clent Ridges, MD cpw:sb D: 10/07/2014 21:00:05 ET T: 10/08/2014 08:16:18 ET JOB#: [id number]  cc: Santina Evans P. Clent Ridges, MD, <Dictator> Gale Journey MD ELECTRONICALLY SIGNED 10/16/2014 10:50

## 2014-12-08 ENCOUNTER — Other Ambulatory Visit: Payer: Self-pay | Admitting: Nurse Practitioner

## 2014-12-08 MED ORDER — MORPHINE SULFATE (CONCENTRATE) 20 MG/ML PO SOLN: ORAL | Status: DC

## 2014-12-10 ENCOUNTER — Other Ambulatory Visit: Payer: Self-pay | Admitting: Internal Medicine

## 2014-12-10 NOTE — Telephone Encounter (Signed)
Last OV 3.14.16, last refill 4.14.16.  Please advise refill

## 2014-12-16 ENCOUNTER — Other Ambulatory Visit: Payer: Self-pay | Admitting: Internal Medicine

## 2014-12-17 NOTE — Telephone Encounter (Signed)
Okay to refill? Prescribed by Dr Mariah MillingGollan

## 2014-12-20 ENCOUNTER — Other Ambulatory Visit: Payer: Self-pay | Admitting: Internal Medicine

## 2014-12-20 NOTE — Telephone Encounter (Signed)
rx faxed

## 2014-12-20 NOTE — Telephone Encounter (Signed)
Last OV 3.14.16, last refill 4.18.16.  Please advise refill

## 2014-12-24 ENCOUNTER — Encounter: Payer: Self-pay | Admitting: Internal Medicine

## 2014-12-29 ENCOUNTER — Ambulatory Visit: Payer: Self-pay | Admitting: Internal Medicine

## 2015-01-06 ENCOUNTER — Encounter: Payer: Self-pay | Admitting: Internal Medicine

## 2015-01-06 ENCOUNTER — Ambulatory Visit (INDEPENDENT_AMBULATORY_CARE_PROVIDER_SITE_OTHER): Payer: Medicare Other | Admitting: Internal Medicine

## 2015-01-06 VITALS — BP 186/80 | HR 99 | Temp 97.5°F | Ht 59.0 in | Wt 100.4 lb

## 2015-01-06 DIAGNOSIS — H6121 Impacted cerumen, right ear: Secondary | ICD-10-CM | POA: Diagnosis not present

## 2015-01-06 DIAGNOSIS — H6120 Impacted cerumen, unspecified ear: Secondary | ICD-10-CM | POA: Insufficient documentation

## 2015-01-06 NOTE — Progress Notes (Signed)
   Subjective:    Patient ID: Wendy Sims, female    DOB: [birth year], [age] y.o.   MRN: [id number]  HPI  [age]YO female presents for acute visit.  Right ear hearing loss - Noted a couple of months ago. No pain. Tried cleaning with peroxide, but did not get any wax out.    Past medical, surgical, family and social history per today's encounter.  Review of Systems  Constitutional: Positive for fatigue. Negative for fever, chills and unexpected weight change.  HENT: Positive for hearing loss. Negative for congestion, ear discharge, ear pain, facial swelling, mouth sores, nosebleeds, postnasal drip, rhinorrhea, sinus pressure, sneezing, sore throat, tinnitus, trouble swallowing and voice change.   Eyes: Negative for pain, discharge, redness and visual disturbance.  Respiratory: Negative for cough, chest tightness, shortness of breath, wheezing and stridor.   Cardiovascular: Negative for chest pain, palpitations and leg swelling.  Musculoskeletal: Negative for myalgias, arthralgias, neck pain and neck stiffness.  Skin: Negative for color change and rash.  Neurological: Negative for dizziness, weakness, light-headedness and headaches.  Hematological: Negative for adenopathy.       Objective:    BP 186/80 mmHg  Pulse 99  Temp(Src) 97.5 F (36.4 C) (Oral)  Ht 4\' 11"  (1.499 m)  Wt 100 lb 6 oz (45.53 kg)  BMI 20.26 kg/m2  SpO2 97% Physical Exam  Constitutional: She is oriented to person, place, and time. She appears well-developed and well-nourished. No distress.  HENT:  Head: Normocephalic and atraumatic.  Right Ear: Tympanic membrane and external ear normal.  Left Ear: Tympanic membrane, external ear and ear canal normal.  Nose: Nose normal.  Mouth/Throat: Oropharynx is clear and moist. No oropharyngeal exudate.  Right ear with cerumen impaction, removed with lavage to show normal canal and TM  Eyes: Conjunctivae are normal. Pupils are equal, round, and reactive to light.  Right eye exhibits no discharge. Left eye exhibits no discharge. No scleral icterus.  Neck: Normal range of motion. Neck supple. No tracheal deviation present. No thyromegaly present.  Cardiovascular: Normal rate, regular rhythm, normal heart sounds and intact distal pulses.  Exam reveals no gallop and no friction rub.   No murmur heard. Pulmonary/Chest: Effort normal and breath sounds normal. No respiratory distress. She has no wheezes. She has no rales. She exhibits no tenderness.  Musculoskeletal: Normal range of motion. She exhibits no edema or tenderness.  Lymphadenopathy:    She has no cervical adenopathy.  Neurological: She is alert and oriented to person, place, and time. No cranial nerve deficit. She exhibits normal muscle tone. Coordination normal.  Skin: Skin is warm and dry. No rash noted. She is not diaphoretic. No erythema. No pallor.  Psychiatric: She has a normal mood and affect. Her behavior is normal. Judgment and thought content normal.          Assessment & Plan:   Problem List Items Addressed This Visit      Unprioritized   Cerumen impaction - Primary    Cerumen removed with water lavage. Will continue to monitor.          Return if symptoms worsen or fail to improve.

## 2015-01-06 NOTE — Assessment & Plan Note (Signed)
Cerumen removed with water lavage. Will continue to monitor.

## 2015-01-06 NOTE — Progress Notes (Signed)
Pre visit review using our clinic review tool, if applicable. No additional management support is needed unless otherwise documented below in the visit note. 

## 2015-01-10 ENCOUNTER — Other Ambulatory Visit: Payer: Self-pay | Admitting: *Deleted

## 2015-01-10 MED ORDER — ASPIRIN 81 MG PO TBEC: DELAYED_RELEASE_TABLET | ORAL | Status: DC

## 2015-01-14 ENCOUNTER — Other Ambulatory Visit: Payer: Self-pay | Admitting: *Deleted

## 2015-01-14 MED ORDER — ALBUTEROL SULFATE 1.25 MG/3ML IN NEBU: 1.0000 | INHALATION_SOLUTION | Freq: Four times a day (QID) | RESPIRATORY_TRACT | Status: AC | PRN

## 2015-01-14 NOTE — Telephone Encounter (Signed)
Ok refill albuterol neb, historical med and request received with directions bid prn. Refill as bid prn or q 6 hours prn?

## 2015-01-18 ENCOUNTER — Ambulatory Visit (INDEPENDENT_AMBULATORY_CARE_PROVIDER_SITE_OTHER): Admitting: Internal Medicine

## 2015-01-18 ENCOUNTER — Encounter: Payer: Self-pay | Admitting: Internal Medicine

## 2015-01-18 VITALS — BP 170/89 | HR 95 | Temp 97.8°F | Wt 98.0 lb

## 2015-01-18 DIAGNOSIS — I5032 Chronic diastolic (congestive) heart failure: Secondary | ICD-10-CM | POA: Diagnosis not present

## 2015-01-18 DIAGNOSIS — R05 Cough: Secondary | ICD-10-CM

## 2015-01-18 DIAGNOSIS — R059 Cough, unspecified: Secondary | ICD-10-CM | POA: Insufficient documentation

## 2015-01-18 LAB — CBC WITH DIFFERENTIAL/PLATELET
BASOS PCT: 0.4 % (ref 0.0–3.0)
Basophils Absolute: 0 10*3/uL (ref 0.0–0.1)
EOS ABS: 0.4 10*3/uL (ref 0.0–0.7)
EOS PCT: 4.4 % (ref 0.0–5.0)
HCT: 37.8 % (ref 36.0–46.0)
HEMOGLOBIN: 12.6 g/dL (ref 12.0–15.0)
LYMPHS PCT: 20.4 % (ref 12.0–46.0)
Lymphs Abs: 2.1 10*3/uL (ref 0.7–4.0)
MCHC: 33.3 g/dL (ref 30.0–36.0)
MCV: 86.6 fl (ref 78.0–100.0)
Monocytes Absolute: 0.7 10*3/uL (ref 0.1–1.0)
Monocytes Relative: 7.1 % (ref 3.0–12.0)
Neutro Abs: 6.9 10*3/uL (ref 1.4–7.7)
Neutrophils Relative %: 67.7 % (ref 43.0–77.0)
PLATELETS: 193 10*3/uL (ref 150.0–400.0)
RBC: 4.36 Mil/uL (ref 3.87–5.11)
RDW: 13.9 % (ref 11.5–15.5)
WBC: 10.1 10*3/uL (ref 4.0–10.5)

## 2015-01-18 LAB — COMPREHENSIVE METABOLIC PANEL
ALBUMIN: 4.2 g/dL (ref 3.5–5.2)
ALT: 7 U/L (ref 0–35)
AST: 17 U/L (ref 0–37)
Alkaline Phosphatase: 63 U/L (ref 39–117)
BUN: 33 mg/dL — AB (ref 6–23)
CALCIUM: 9.7 mg/dL (ref 8.4–10.5)
CHLORIDE: 91 meq/L — AB (ref 96–112)
CO2: 28 mEq/L (ref 19–32)
CREATININE: 1.17 mg/dL (ref 0.40–1.20)
GFR: 44.83 mL/min — ABNORMAL LOW (ref >60.00)
Glucose, Bld: 80 mg/dL (ref 70–99)
POTASSIUM: 4.4 meq/L (ref 3.5–5.1)
Sodium: 128 mEq/L — ABNORMAL LOW (ref 135–145)
Total Bilirubin: 0.5 mg/dL (ref 0.2–1.2)
Total Protein: 7.4 g/dL (ref 6.0–8.3)

## 2015-01-18 MED ORDER — GUAIFENESIN-CODEINE 100-10 MG/5ML PO SOLN: 5.0000 mL | Freq: Three times a day (TID) | ORAL | Status: AC | PRN

## 2015-01-18 MED ORDER — AMOXICILLIN-POT CLAVULANATE 500-125 MG PO TABS: 1.0000 | ORAL_TABLET | Freq: Two times a day (BID) | ORAL | Status: DC

## 2015-01-18 NOTE — Patient Instructions (Signed)
Start Augmentin 500mg  twice daily to help treat lung infection. Continue Albuterol every 6 hours as needed. Start Robitussin AC as needed for cough.  Call or email if symptoms are not improving this week.  Follow up next week for recheck.

## 2015-01-18 NOTE — Progress Notes (Signed)
Pre visit review using our clinic review tool, if applicable. No additional management support is needed unless otherwise documented below in the visit note. 

## 2015-01-18 NOTE — Progress Notes (Signed)
Subjective:    Patient ID: Wendy Sims, female    DOB: [birth year], 81 y.o.   MRN: [id number]  HPI  81YO female presents for acute visit.  Cough - Increased cough this week. Cough productive of thick sputum. Started on Albuterol prn, taking about twice daily. Also taking Robitussin with no improvement. No fever, chills.   Wt Readings from Last 3 Encounters:  01/18/15 98 lb (44.453 kg)  01/06/15 100 lb 6 oz (45.53 kg)  11/17/14 101 lb 8 oz (46.04 kg)     Past medical, surgical, family and social history per today's encounter.  Review of Systems  Constitutional: Positive for appetite change. Negative for fever, chills and unexpected weight change.  HENT: Positive for congestion and rhinorrhea. Negative for ear discharge, ear pain, facial swelling, hearing loss, mouth sores, nosebleeds, postnasal drip, sinus pressure, sneezing, sore throat, tinnitus, trouble swallowing and voice change.   Eyes: Negative for pain, discharge, redness and visual disturbance.  Respiratory: Positive for cough and shortness of breath. Negative for chest tightness, wheezing and stridor.   Cardiovascular: Negative for chest pain, palpitations and leg swelling.  Musculoskeletal: Negative for myalgias, arthralgias, neck pain and neck stiffness.  Skin: Negative for color change and rash.  Neurological: Negative for dizziness, weakness, light-headedness and headaches.  Hematological: Negative for adenopathy.       Objective:    BP 170/89 mmHg  Pulse 95  Temp(Src) 97.8 F (36.6 C) (Oral)  Wt 98 lb (44.453 kg)  SpO2 96% Physical Exam  Constitutional: She is oriented to person, place, and time. She appears well-developed and well-nourished. No distress.  HENT:  Head: Normocephalic and atraumatic.  Right Ear: External ear normal.  Left Ear: External ear normal.  Nose: Nose normal.  Mouth/Throat: Oropharynx is clear and moist. No oropharyngeal exudate.  Eyes: Conjunctivae are normal.  Pupils are equal, round, and reactive to light. Right eye exhibits no discharge. Left eye exhibits no discharge. No scleral icterus.  Neck: Normal range of motion. Neck supple. No tracheal deviation present. No thyromegaly present.  Cardiovascular: Normal rate, regular rhythm, normal heart sounds and intact distal pulses.  Exam reveals no gallop and no friction rub.   No murmur heard. Pulmonary/Chest: Effort normal. No accessory muscle usage. No tachypnea. No respiratory distress. She has no decreased breath sounds. She has no wheezes. She has rhonchi (scattered). She has no rales. She exhibits no tenderness.  Musculoskeletal: Normal range of motion. She exhibits no edema or tenderness.  Lymphadenopathy:    She has no cervical adenopathy.  Neurological: She is alert and oriented to person, place, and time. No cranial nerve deficit. She exhibits normal muscle tone. Coordination normal.  Skin: Skin is warm and dry. No rash noted. She is not diaphoretic. No erythema. No pallor.  Psychiatric: She has a normal mood and affect. Her behavior is normal. Judgment and thought content normal.          Assessment & Plan:   Problem List Items Addressed This Visit      Unprioritized   Atrial fibrillation   Chronic diastolic congestive heart failure    Appears euvolemic on exam. Continue current medications. Renal function with labs.      Relevant Orders   Comprehensive metabolic panel   Cough - Primary    Productive cough over 1 week. Exam most c/w acute URI, likely initially viral, now with secondary bacterial bronchitis. Will start renal-dosed Augmentin. Continue prn Albuterol. Add Robitussin AC for cough. Follow up by phone and  email this week and in visit next week.      Relevant Medications   amoxicillin-clavulanate (AUGMENTIN) 500-125 MG per tablet   Other Relevant Orders   CBC w/Diff       Return in about 1 week (around 01/25/2015) for Recheck.

## 2015-01-18 NOTE — Assessment & Plan Note (Signed)
Productive cough over 1 week. Exam most c/w acute URI, likely initially viral, now with secondary bacterial bronchitis. Will start renal-dosed Augmentin. Continue prn Albuterol. Add Robitussin AC for cough. Follow up by phone and email this week and in visit next week.

## 2015-01-18 NOTE — Assessment & Plan Note (Signed)
Appears euvolemic on exam. Continue current medications. Renal function with labs.

## 2015-01-20 ENCOUNTER — Encounter: Payer: Self-pay | Admitting: Internal Medicine

## 2015-01-20 DIAGNOSIS — R05 Cough: Secondary | ICD-10-CM

## 2015-01-20 DIAGNOSIS — R059 Cough, unspecified: Secondary | ICD-10-CM

## 2015-01-21 ENCOUNTER — Encounter: Payer: Self-pay | Admitting: Internal Medicine

## 2015-01-26 ENCOUNTER — Ambulatory Visit: Payer: Medicare Other | Admitting: Internal Medicine

## 2015-02-08 ENCOUNTER — Other Ambulatory Visit: Payer: Self-pay | Admitting: Internal Medicine

## 2015-02-08 NOTE — Telephone Encounter (Signed)
Not on current med list, ok to re fill? ?

## 2015-02-09 NOTE — Telephone Encounter (Signed)
Ok to refill,  Refill sent  

## 2015-03-07 DIAGNOSIS — I272 Other secondary pulmonary hypertension: Secondary | ICD-10-CM | POA: Diagnosis not present

## 2015-04-28 ENCOUNTER — Other Ambulatory Visit: Payer: Self-pay | Admitting: *Deleted

## 2015-04-28 ENCOUNTER — Telehealth: Payer: Self-pay | Admitting: Internal Medicine

## 2015-04-28 DIAGNOSIS — R05 Cough: Secondary | ICD-10-CM

## 2015-04-28 DIAGNOSIS — R059 Cough, unspecified: Secondary | ICD-10-CM

## 2015-04-28 MED ORDER — AMOXICILLIN-POT CLAVULANATE 500-125 MG PO TABS: 1.0000 | ORAL_TABLET | Freq: Two times a day (BID) | ORAL | Status: DC

## 2015-04-28 NOTE — Telephone Encounter (Signed)
Tonya from Hospice states pt is having cough, runny nose, diff breathing.  States pt has been taking OTC Robitussin DM 10mL every 4 hours x 5 days.  Please advise

## 2015-04-28 NOTE — Telephone Encounter (Signed)
Spoke with Georgiann Hahn, advised of MDs message.  Verbalized understanding

## 2015-04-28 NOTE — Telephone Encounter (Signed)
We can start Augmentin  po bid x 7 days. We can get a portable CXR if they would like.

## 2015-04-28 NOTE — Telephone Encounter (Signed)
Pt daughter Steward Drone [phone] called about pt needing an antibiotic called in. Hospice called for Dr Dan Humphreys to call an antibiotic in. Pharmacy is [facility]. Call [facility] for more information [phone]. Thank You!

## 2015-05-02 ENCOUNTER — Telehealth: Payer: Self-pay | Admitting: Internal Medicine

## 2015-05-02 NOTE — Telephone Encounter (Signed)
Marchelle Folks from [facility] [place] called about clarification on her PRN for Miralax. Please fax to [phone]. Thank You!

## 2015-05-02 NOTE — Telephone Encounter (Signed)
Faxed order to them 

## 2015-05-05 ENCOUNTER — Encounter: Payer: Self-pay | Admitting: Internal Medicine

## 2015-05-16 ENCOUNTER — Ambulatory Visit: Payer: Medicare Other | Admitting: Cardiovascular Disease

## 2015-06-07 NOTE — Patient Outreach (Signed)
[facility](THN) Care Management  06/07/2015  Tresia Vickki HearingJarrett Mathisen 1909/07/11 [id number]   Referral from [employer] Tier 4, assigned Fleeta Emmerionne Leath, RN to outreach for [facility] Care [facility] services.  Thanks, Corrie MckusickLisa O. Sharlee BlewMoore, AABA [facility] Care Management [facility] CM Assistant Phone: [phone] Fax: [phone]

## 2015-06-09 ENCOUNTER — Other Ambulatory Visit: Payer: Self-pay

## 2015-06-09 NOTE — Patient Outreach (Signed)
[facility](THN) Care Management  06/09/2015  Wendy Sims Apr 18, 1909 [id number]   Referral from [employer] tier 4 CHF, 1 admit, 1ED visit  Telephone call to patient.  No answer.  HIPAA compliant voice message left.    Plan: RN Health Coach will attempt patient within 1-2 weeks.    Wendy Lericheionne J Jetta Murray, RN, MSN [facility] Care Management RN Telephonic Health Coach [phone]

## 2015-06-13 ENCOUNTER — Other Ambulatory Visit: Payer: Self-pay

## 2015-06-13 NOTE — Patient Outreach (Signed)
[facility](THN) Care Management  06/13/2015  Wendy Sims September 16, 1908 [id number]  2nd Telephone call for Tier 4 [employer] referral.  No answer.  HIPAA compliant voice message left.    Plan: RN Health Coach will contact patient within 1-2 weeks.  Bary Lericheionne J Ugonna Keirsey, RN, MSN [facility] Care Management RN Telephonic Health Coach [phone]

## 2015-06-14 ENCOUNTER — Encounter: Payer: Self-pay | Admitting: Internal Medicine

## 2015-06-15 ENCOUNTER — Other Ambulatory Visit: Payer: Self-pay | Admitting: Internal Medicine

## 2015-06-15 ENCOUNTER — Telehealth: Payer: Self-pay | Admitting: Internal Medicine

## 2015-06-15 ENCOUNTER — Other Ambulatory Visit: Payer: Self-pay

## 2015-06-15 NOTE — Telephone Encounter (Signed)
Wendy Sims hospice number is [phone] called from [facility] regarding pt being on sena pt is constaipated. Order to increase the sena twice a day. Fax number is [phone].  Thank You!

## 2015-06-15 NOTE — Telephone Encounter (Signed)
Okay to fill?  Medication not listed on current medication list

## 2015-06-15 NOTE — Telephone Encounter (Signed)
Fine to change senna to twice daily.

## 2015-06-15 NOTE — Patient Outreach (Signed)
[facility](THN) Care Management  06/15/2015  [name] 1909/04/23 [id number]   Telephone call to patient daughter Wendy Sims for [employer] Tier 4 screen.  D[facility] and that patient is on hospice at this time.  Advised on [facility] Care [facility] services and purpose of the call. Advised daughter the due to patient being on hospice [facility] would not cover at this time.  She verbalized understanding.  Daughter is agreeable to receiving letter and brochure.   Plan: RN Health Coach will send letter and brochure. RN Health Coach will send  in basket to Nena PolioLisa Moore for case closure.   Wendy Lericheionne J Avannah Decker, RN, MSN [facility] Care Management RN Telephonic Health Coach [phone]

## 2015-06-16 ENCOUNTER — Other Ambulatory Visit: Payer: Self-pay

## 2015-06-16 NOTE — Telephone Encounter (Signed)
We can write an order and ask Dr. Adriana Simasook or Darrick Huntsmanullo to sign it. We sent a signed order for Senna earlier this week, but they may not have received it.

## 2015-06-16 NOTE — Telephone Encounter (Signed)
Left message for nurse to call back regarding

## 2015-06-16 NOTE — Patient Outreach (Signed)
[facility](THN) Care Management  06/16/2015  Wendy Vickki HearingJarrett Dancer July 07, 1909 [id number]   Notification from Fleeta Emmerionne Leath, RN to close case as patient is on Hospice care.  Thanks, Corrie MckusickLisa O. Sharlee BlewMoore, AABA [facility] Care Management [facility] CM Assistant Phone: [phone] Fax: [phone]

## 2015-06-16 NOTE — Telephone Encounter (Signed)
Can you have one of the provider sign an order for this patient to take senna bid. I am not sure of the doses. I am thinking its 8.5.

## 2015-06-16 NOTE — Telephone Encounter (Signed)
Hospice needs an writtten order for SENNA BID sign and sent [phone]

## 2015-06-17 ENCOUNTER — Other Ambulatory Visit: Payer: Self-pay | Admitting: Family Medicine

## 2015-06-17 MED ORDER — SENNA 8.6 MG PO TABS: 1.0000 | ORAL_TABLET | Freq: Two times a day (BID) | ORAL | Status: DC

## 2015-06-17 NOTE — Telephone Encounter (Signed)
I do not have a fax for the requested medication, Please advise dosage and i will send Rx

## 2015-06-17 NOTE — Telephone Encounter (Signed)
Rx faxed

## 2015-06-20 ENCOUNTER — Telehealth: Payer: Self-pay | Admitting: Cardiovascular Disease

## 2015-06-20 NOTE — Telephone Encounter (Signed)
Patient cancelled appt made from recall.  She will call back when she is ready to reschedule.  Deleting recall.

## 2015-06-26 ENCOUNTER — Other Ambulatory Visit: Payer: Self-pay | Admitting: Internal Medicine

## 2015-06-29 ENCOUNTER — Other Ambulatory Visit: Payer: Self-pay | Admitting: Internal Medicine

## 2015-07-07 DIAGNOSIS — I5033 Acute on chronic diastolic (congestive) heart failure: Secondary | ICD-10-CM | POA: Diagnosis not present

## 2015-07-07 DIAGNOSIS — I272 Other secondary pulmonary hypertension: Secondary | ICD-10-CM

## 2015-07-07 DIAGNOSIS — I482 Chronic atrial fibrillation: Secondary | ICD-10-CM

## 2015-07-07 DIAGNOSIS — R627 Adult failure to thrive: Secondary | ICD-10-CM

## 2015-07-14 ENCOUNTER — Other Ambulatory Visit: Payer: Self-pay | Admitting: Internal Medicine

## 2015-07-14 NOTE — Telephone Encounter (Signed)
Last refilled on 12/20/2014 #30 with 3 refills.  Last OV was 01/18/15.  Please advise?

## 2015-07-16 ENCOUNTER — Other Ambulatory Visit: Payer: Self-pay | Admitting: Internal Medicine

## 2015-07-29 ENCOUNTER — Other Ambulatory Visit: Payer: Self-pay | Admitting: Family Medicine

## 2015-08-05 ENCOUNTER — Telehealth: Payer: Self-pay | Admitting: *Deleted

## 2015-08-05 NOTE — Telephone Encounter (Signed)
Please advise 

## 2015-08-05 NOTE — Telephone Encounter (Signed)
Fine to send over order. 

## 2015-08-05 NOTE — Telephone Encounter (Signed)
Printed and faxed

## 2015-08-05 NOTE — Telephone Encounter (Signed)
Wendy ShearerSherry Padgett [facility] requested a written order for patient to have ear irrigation, due to ear wax build up. This order can be faxed to [phone], Attn. Wendy ShearerSherry Padgett

## 2015-08-23 ENCOUNTER — Telehealth: Payer: Self-pay | Admitting: Internal Medicine

## 2015-08-23 ENCOUNTER — Other Ambulatory Visit: Payer: Self-pay | Admitting: *Deleted

## 2015-08-23 MED ORDER — ACETAMINOPHEN 325 MG PO TABS: 650.0000 mg | ORAL_TABLET | Freq: Four times a day (QID) | ORAL | Status: AC | PRN

## 2015-08-23 NOTE — Telephone Encounter (Signed)
Rx sent to pharmacy   

## 2015-08-23 NOTE — Telephone Encounter (Signed)
Patient wants to take two of her extra strength tylenol in the morning and would like the new prescription faxed to [facility] at [phone].

## 2015-08-24 NOTE — Telephone Encounter (Signed)
[facility] wanted to know if the order for Tylenol  was d/c'd in our system, I stated that her current med list shows that she has a Rx for Tylenol ( ) tabs.Maeola Sarah

## 2015-08-26 ENCOUNTER — Telehealth: Payer: Self-pay

## 2015-08-26 NOTE — Telephone Encounter (Signed)
Pt's nurse called to clarify the order for tylenol.

## 2015-08-27 ENCOUNTER — Other Ambulatory Visit: Payer: Self-pay | Admitting: Internal Medicine

## 2015-09-01 ENCOUNTER — Telehealth: Payer: Self-pay | Admitting: Internal Medicine

## 2015-09-01 NOTE — Telephone Encounter (Signed)
Cat [phone] and fax number [phone] called from [facility] [place] regarding pt has a order for tylenol 500 mg every morning. Cat is stating they need a new script. Pharmacy stated he contacted our office and was told to take off the tylenol . Order was faxed over on 08/29/2015 for more clarification from [facility]. Cat is stating she needs a prescription for tylenol  x2 at bedtime and 1 for am. Pharmacy is [facility] [place] [place] - [address] Documentation needed saying herfurosemide (LASIX) 20 MG tablet  order is correct each morning and 1tab by mouth daily as needed. Thank you!

## 2015-09-01 NOTE — Telephone Encounter (Signed)
Please advise 

## 2015-09-02 NOTE — Telephone Encounter (Signed)
Information faxed to hospice

## 2015-09-08 ENCOUNTER — Other Ambulatory Visit: Payer: Self-pay

## 2015-09-08 ENCOUNTER — Telehealth: Payer: Self-pay | Admitting: *Deleted

## 2015-09-08 MED ORDER — DOCUSATE SODIUM 100 MG PO CAPS: ORAL_CAPSULE | ORAL | Status: AC

## 2015-09-08 MED ORDER — SENNA 8.6 MG PO TABS: ORAL_TABLET | ORAL | Status: AC

## 2015-09-08 NOTE — Telephone Encounter (Signed)
[facility] [place] stated that patient requested to have her Senna 8.6 taken twice daily. Family of patient also requested that the have the fibro lax 625 be discontinued.  Contact [phone] Fax [phone]

## 2015-09-08 NOTE — Telephone Encounter (Signed)
Senekot filled

## 2015-09-08 NOTE — Telephone Encounter (Signed)
Colace  ordered

## 2015-09-09 ENCOUNTER — Encounter: Payer: Self-pay | Admitting: Internal Medicine

## 2015-09-29 ENCOUNTER — Other Ambulatory Visit: Payer: Self-pay | Admitting: Internal Medicine

## 2015-10-20 ENCOUNTER — Encounter: Payer: Self-pay | Admitting: Internal Medicine

## 2015-10-20 ENCOUNTER — Telehealth: Payer: Self-pay | Admitting: Internal Medicine

## 2015-10-20 NOTE — Telephone Encounter (Signed)
My Chart

## 2015-10-22 ENCOUNTER — Encounter: Payer: Self-pay | Admitting: Internal Medicine

## 2015-10-25 ENCOUNTER — Telehealth: Payer: Self-pay | Admitting: Internal Medicine

## 2015-10-25 ENCOUNTER — Ambulatory Visit (INDEPENDENT_AMBULATORY_CARE_PROVIDER_SITE_OTHER): Payer: Medicare Other

## 2015-10-25 DIAGNOSIS — L0100 Impetigo, unspecified: Secondary | ICD-10-CM | POA: Diagnosis not present

## 2015-10-25 DIAGNOSIS — H6121 Impacted cerumen, right ear: Secondary | ICD-10-CM | POA: Diagnosis not present

## 2015-10-25 DIAGNOSIS — L0390 Cellulitis, unspecified: Secondary | ICD-10-CM | POA: Insufficient documentation

## 2015-10-25 MED ORDER — GENTAMICIN SULFATE 0.1 % EX OINT: 1.0000 "application " | TOPICAL_OINTMENT | Freq: Three times a day (TID) | CUTANEOUS | Status: AC

## 2015-10-25 NOTE — Telephone Encounter (Signed)
Pt daughter called about medication gentamicin ointment (GARAMYCIN) 0.1 % needs to be called into [facility] [place] [place] - [address] The other pharmacy [facility] did not have the medication. Call daughter @ [phone]. Thank you!

## 2015-10-25 NOTE — Progress Notes (Signed)
Cerumen impaction improved with bilateral ear lavage. Noted to have approx 1cm diameter yellow-crusted pustule left cheek. We discussed that this may be recurrent SCC. 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 Will start topical Gentamicin ointment for secondary infection. Pt daughter will email with update.

## 2015-10-25 NOTE — Assessment & Plan Note (Signed)
Impetigo left cheek. Will try adding topical Gentamicin. Her daughter will email with picture and update later this week.

## 2015-10-25 NOTE — Telephone Encounter (Signed)
Medication already sent

## 2015-10-25 NOTE — Progress Notes (Signed)
Patient is coming in for a bilateral ear irrigation, per Dr.Walker. [facility] but they were unable to remove it.Patient has a rare spot on her right ear canal. Patient tolerated well. Dr.Walker checked ears as well.

## 2015-10-25 NOTE — Telephone Encounter (Signed)
Ok. Thank you.

## 2015-10-27 ENCOUNTER — Other Ambulatory Visit: Payer: Self-pay | Admitting: *Deleted

## 2015-11-04 ENCOUNTER — Telehealth: Payer: Self-pay

## 2015-11-04 NOTE — Telephone Encounter (Signed)
[facility]

## 2015-11-04 NOTE — Telephone Encounter (Signed)
Wendy Sims states that the pt's bp is consistantly dropping today it was 85/50, yesterday 90/52, pulse has been running in the 90-70's. [name] is questioning if she can stop there Coreg and or Lasix. Please advise, thanks

## 2015-11-04 NOTE — Telephone Encounter (Signed)
Please hold Lasix today and tomorrow and Sunday. IF BP<90/60, hold Carvedilol. Have her call back with BP readings Monday.

## 2015-11-06 ENCOUNTER — Encounter: Payer: Self-pay | Admitting: Internal Medicine

## 2015-11-07 ENCOUNTER — Telehealth: Payer: Self-pay | Admitting: Internal Medicine

## 2015-11-07 MED ORDER — ASPIRIN 81 MG PO TBEC: DELAYED_RELEASE_TABLET | ORAL | Status: AC

## 2015-11-07 NOTE — Telephone Encounter (Signed)
Please advise, can I give order, I see she is on daily Lasix?

## 2015-11-07 NOTE — Telephone Encounter (Signed)
Renea Eevelyn [phone] called from [facility] [place] regarding needing a order for Lasix. Fax number [phone]. Thank you!

## 2015-11-07 NOTE — Telephone Encounter (Signed)
Yes. Fine to continue daily Lasix

## 2015-11-07 NOTE — Telephone Encounter (Signed)
Per Dr Dan HumphreysWalker, restart carvedilol, hold if BP  <90/60 or heart rate  <60 .... [facility] [place]

## 2015-11-07 NOTE — Telephone Encounter (Signed)
I have not received any BP readings

## 2015-11-07 NOTE — Telephone Encounter (Signed)
Left on Dr. Tilman NeatWalker's desk to sign.

## 2015-11-07 NOTE — Telephone Encounter (Signed)
Cat [phone] called from [facility] [place] regarding readings being fax today and need a follow up call on discontinuing or continuing any medications. Thank you!

## 2015-11-07 NOTE — Telephone Encounter (Signed)
Have you received any readings [facility] [place] today in Dr. Tilman NeatWalker's mailbox? thanks

## 2015-11-08 ENCOUNTER — Telehealth: Payer: Self-pay | Admitting: Internal Medicine

## 2015-11-08 NOTE — Telephone Encounter (Signed)
Wendy Sims from [facility] . E-script for Asprin 81 mg was sent in this medication was DC'd on 1.10.17 . He wanted to know was this a new start or was it sent in error.

## 2015-11-08 NOTE — Telephone Encounter (Signed)
Sp[facility] with Rosanne AshingJim at [facility] to Clarify.

## 2015-11-09 ENCOUNTER — Encounter: Payer: Self-pay | Admitting: Internal Medicine

## 2015-12-02 ENCOUNTER — Other Ambulatory Visit: Payer: Self-pay | Admitting: Internal Medicine

## 2015-12-05 ENCOUNTER — Other Ambulatory Visit: Payer: Self-pay | Admitting: Internal Medicine

## 2015-12-05 MED ORDER — MORPHINE SULFATE (CONCENTRATE) 20 MG/ML PO SOLN: ORAL | Status: AC

## 2015-12-07 ENCOUNTER — Other Ambulatory Visit: Payer: Self-pay | Admitting: Internal Medicine

## 2015-12-07 NOTE — Telephone Encounter (Signed)
Please advise refill, thanks 

## 2015-12-12 ENCOUNTER — Other Ambulatory Visit: Payer: Self-pay | Admitting: Internal Medicine

## 2015-12-12 DIAGNOSIS — R059 Cough, unspecified: Secondary | ICD-10-CM

## 2015-12-12 DIAGNOSIS — R05 Cough: Secondary | ICD-10-CM

## 2015-12-12 MED ORDER — AMOXICILLIN-POT CLAVULANATE 500-125 MG PO TABS: 1.0000 | ORAL_TABLET | Freq: Two times a day (BID) | ORAL | Status: AC

## 2015-12-15 ENCOUNTER — Other Ambulatory Visit: Payer: Self-pay | Admitting: Internal Medicine

## 2015-12-15 MED ORDER — FUROSEMIDE 20 MG PO TABS: ORAL_TABLET | ORAL | Status: AC

## 2015-12-16 ENCOUNTER — Other Ambulatory Visit: Payer: Self-pay

## 2015-12-16 MED ORDER — LORAZEPAM 0.5 MG PO TABS: 0.2500 mg | ORAL_TABLET | Freq: Four times a day (QID) | ORAL | Status: AC | PRN

## 2015-12-18 ENCOUNTER — Encounter: Payer: Self-pay | Admitting: Internal Medicine

## 2015-12-18 ENCOUNTER — Encounter: Payer: Self-pay | Admitting: Cardiovascular Disease

## 2015-12-29 ENCOUNTER — Telehealth: Payer: Self-pay | Admitting: Internal Medicine

## 2015-12-29 NOTE — Telephone Encounter (Signed)
Clydie BraunKaren called from [facility] regarding needing a copy of pt death certificate that was mailed from the funeral home? It was mailed to [facility] and they have misplaced the death certificate. So if we have it health dept needs it signed and sent back. Please call Clydie BraunKaren if it was seen or not [phone]. Thank you!

## 2015-12-29 NOTE — Telephone Encounter (Signed)
Karen called from Health dept and vital records regarding needing a copy of pt death certificate that was mailed from the funeral home? It was mailed to [facility] and they have misplaced the death certificate. So if we have it health dept needs it signed and sent back. Please call Karen if it was seen or not [phone]. Thank you! °

## 2015-12-30 NOTE — Telephone Encounter (Signed)
Spoke with Clydie BraunKaren and she will send another death certificate

## 2015-12-30 NOTE — Telephone Encounter (Signed)
I have never received a death certificate on this patient

## 2016-01-05 DEATH — deceased
